# Patient Record
Sex: Female | Born: 1985
Health system: Southern US, Community
[De-identification: ages and names within clinical notes are randomized; demographics above are authoritative.]

## PROBLEM LIST (undated history)

## (undated) DIAGNOSIS — J45909 Unspecified asthma, uncomplicated: Secondary | ICD-10-CM

## (undated) DIAGNOSIS — F32A Depression, unspecified: Secondary | ICD-10-CM

## (undated) DIAGNOSIS — G43909 Migraine, unspecified, not intractable, without status migrainosus: Secondary | ICD-10-CM

## (undated) DIAGNOSIS — F419 Anxiety disorder, unspecified: Secondary | ICD-10-CM

## (undated) DIAGNOSIS — F4310 Post-traumatic stress disorder, unspecified: Secondary | ICD-10-CM

## (undated) DIAGNOSIS — J40 Bronchitis, not specified as acute or chronic: Secondary | ICD-10-CM

## (undated) HISTORY — DX: Depression, unspecified: F32.A

## (undated) HISTORY — PX: APPENDECTOMY: SHX54

## (undated) HISTORY — DX: Post-traumatic stress disorder, unspecified: F43.10

## (undated) HISTORY — PX: TONSILLECTOMY: SUR1361

## (undated) HISTORY — DX: Anxiety disorder, unspecified: F41.9

## (undated) HISTORY — DX: Migraine, unspecified, not intractable, without status migrainosus: G43.909

---

## 2017-06-14 ENCOUNTER — Emergency Department
Admission: EM | Admit: 2017-06-14 | Discharge: 2017-06-14 | Disposition: A | Discharge status: Home / Self Care | Payer: Self-pay | Attending: Emergency Medicine | Admitting: Emergency Medicine

## 2017-06-14 ENCOUNTER — Encounter: Payer: Self-pay | Admitting: Emergency Medicine

## 2017-06-14 ENCOUNTER — Other Ambulatory Visit: Payer: Self-pay

## 2017-06-14 DIAGNOSIS — M5441 Lumbago with sciatica, right side: Secondary | ICD-10-CM | POA: Insufficient documentation

## 2017-06-14 DIAGNOSIS — G8929 Other chronic pain: Secondary | ICD-10-CM

## 2017-06-14 MED ORDER — CYCLOBENZAPRINE HCL 10 MG PO TABS: 10.0000 mg | ORAL_TABLET | Freq: Three times a day (TID) | ORAL | 0 refills | Status: DC | PRN

## 2017-06-14 MED ORDER — NAPROXEN 500 MG PO TABS: 500.0000 mg | ORAL_TABLET | Freq: Two times a day (BID) | ORAL | 0 refills | Status: DC

## 2017-06-14 NOTE — ED Provider Notes (Signed)
[facility] Emergency Department Provider Note  ____________________________________________  Time seen: Approximately 3:55 PM  I have reviewed the triage vital signs and the nursing notes.   HISTORY  Chief Complaint Back Pain    HPI Wendy Proctor is a 32 y.o. female who presents the emergency department complaining of lower back pain.  Patient reports that she attempted to go to work when her lower back began to hurt.  Patient reports that she has had similar symptoms for "a long time."  No new trauma.  No dysuria or polyuria.  Patient denies any radicular symptoms.  Patient reports that "now that I have a job I cannot lay down like it normally do."  Patient is requesting 2 days off from work.  History reviewed. No pertinent past medical history.  There are no active problems to display for this patient.   History reviewed. No pertinent surgical history.  Prior to Admission medications   Medication Sig Start Date End Date Taking? Authorizing Provider  cyclobenzaprine (FLEXERIL) 10 MG tablet Take 1 tablet (10 mg total) by mouth 3 (three) times daily as needed for muscle spasms. 06/14/17   Cuthriell, Delorise RoyalsJonathan D, PA-C  naproxen (NAPROSYN) 500 MG tablet Take 1 tablet (500 mg total) by mouth 2 (two) times daily with a meal. 06/14/17   Cuthriell, Delorise RoyalsJonathan D, PA-C    Allergies Patient has no known allergies.  No family history on file.  Social History Social History   Tobacco Use  . Smoking status: Not on file  Substance Use Topics  . Alcohol use: Not on file  . Drug use: Not on file     Review of Systems  Constitutional: No fever/chills Eyes: No visual changes.  Cardiovascular: no chest pain. Respiratory: no cough. No SOB. Gastrointestinal: No abdominal pain.  No nausea, no vomiting.  No diarrhea.  No constipation. Genitourinary: Negative for dysuria. No hematuria Musculoskeletal: Positive for lower back pain. Skin: Negative for rash, abrasions,  lacerations, ecchymosis. Neurological: Negative for headaches, focal weakness or numbness. 10-point ROS otherwise negative.  ____________________________________________   PHYSICAL EXAM:  VITAL SIGNS: ED Triage Vitals  Enc Vitals Group     BP 06/14/17 1316 127/76     Pulse Rate 06/14/17 1316 70     Resp 06/14/17 1316 18     Temp 06/14/17 1316 98.8 F (37.1 C)     Temp Source 06/14/17 1316 Oral     SpO2 06/14/17 1316 98 %     Weight 06/14/17 1316 210 lb (95.3 kg)     Height 06/14/17 1316 5\' 3"  (1.6 m)     Head Circumference --      Peak Flow --      Pain Score 06/14/17 1326 8     Pain Loc --      Pain Edu? --      Excl. in GC? --      Constitutional: Alert and oriented. Well appearing and in no acute distress. Eyes: Conjunctivae are normal. PERRL. EOMI. Head: Atraumatic. ENT:      Ears:       Nose: No congestion/rhinnorhea.      Mouth/Throat: Mucous membranes are moist.  Neck: No stridor.  No cervical spine tenderness to palpation. Cardiovascular: Normal rate, regular rhythm. Normal S1 and S2.  Good peripheral circulation. Respiratory: Normal respiratory effort without tachypnea or retractions. Lungs CTAB. Good air entry to the bases with no decreased or absent breath sounds. Gastrointestinal: Bowel sounds 4 quadrants. Soft and nontender to palpation. No  guarding or rigidity. No palpable masses. No distention. No CVA tenderness. Musculoskeletal: Full range of motion to all extremities. No gross deformities appreciated. Neurologic:  Normal speech and language. No gross focal neurologic deficits are appreciated.  Skin:  Skin is warm, dry and intact. No rash noted. Psychiatric: Mood and affect are normal. Speech and behavior are normal. Patient exhibits appropriate insight and judgement.   ____________________________________________   LABS (all labs ordered are listed, but only abnormal results are displayed)  Labs Reviewed - No data to  display ____________________________________________  EKG   ____________________________________________  RADIOLOGY   No results found.  ____________________________________________    PROCEDURES  Procedure(s) performed:    Procedures    Medications - No data to display   ____________________________________________   INITIAL IMPRESSION / ASSESSMENT AND PLAN / ED COURSE  Pertinent labs & imaging results that were available during my care of the patient were reviewed by me and considered in my medical decision making (see chart for details).  Review of the [place] CSRS was performed in accordance of the [facility] prior to dispensing any controlled drugs.     Patient's diagnosis is consistent with lumbago.  Patient with mild flare of chronic back pain.  No concerning symptoms.  Exam is reassuring.  No indication for labs or imaging at this time.. Patient will be discharged home with prescriptions for Naprosyn and Flexeril. Patient is to follow up with primary care as needed or otherwise directed. Patient is given ED precautions to return to the ED for any worsening or new symptoms.     ____________________________________________  FINAL CLINICAL IMPRESSION(S) / ED DIAGNOSES  Final diagnoses:  Chronic midline low back pain with right-sided sciatica      NEW MEDICATIONS STARTED DURING THIS VISIT:  ED Discharge Orders        Ordered    naproxen (NAPROSYN) 500 MG tablet  2 times daily with meals     06/14/17 1606    cyclobenzaprine (FLEXERIL) 10 MG tablet  3 times daily PRN     06/14/17 1606          This chart was dictated using voice recognition software/Dragon. Despite best efforts to proofread, errors can occur which can change the meaning. Any change was purely unintentional.    Racheal Patches, PA-C 06/14/17 1608    Phineas Semen, MD 06/14/17 1630

## 2017-06-14 NOTE — ED Triage Notes (Signed)
Pt states she was unable to work today due to her back pain, reports on going pain at the bottom of her spine, states that working seems to aggravate it, pt states that she needs a note

## 2018-01-08 ENCOUNTER — Emergency Department
Admission: EM | Admit: 2018-01-08 | Discharge: 2018-01-08 | Disposition: A | Discharge status: Home / Self Care | Payer: Self-pay | Attending: Emergency Medicine | Admitting: Emergency Medicine

## 2018-01-08 ENCOUNTER — Encounter: Payer: Self-pay | Admitting: Emergency Medicine

## 2018-01-08 ENCOUNTER — Other Ambulatory Visit: Payer: Self-pay

## 2018-01-08 ENCOUNTER — Emergency Department: Payer: Self-pay

## 2018-01-08 DIAGNOSIS — J40 Bronchitis, not specified as acute or chronic: Secondary | ICD-10-CM | POA: Insufficient documentation

## 2018-01-08 DIAGNOSIS — F17210 Nicotine dependence, cigarettes, uncomplicated: Secondary | ICD-10-CM | POA: Insufficient documentation

## 2018-01-08 HISTORY — DX: Unspecified asthma, uncomplicated: J45.909

## 2018-01-08 HISTORY — DX: Bronchitis, not specified as acute or chronic: J40

## 2018-01-08 LAB — BASIC METABOLIC PANEL
ANION GAP: 7 (ref 5–15)
BUN: 9 mg/dL (ref 6–20)
CO2: 25 mmol/L (ref 22–32)
Calcium: 8.9 mg/dL (ref 8.9–10.3)
Chloride: 107 mmol/L (ref 98–111)
Creatinine, Ser: 0.6 mg/dL (ref 0.44–1.00)
GLUCOSE: 97 mg/dL (ref 70–99)
Potassium: 3.5 mmol/L (ref 3.5–5.1)
SODIUM: 139 mmol/L (ref 135–145)

## 2018-01-08 LAB — CBC
HCT: 44.9 % (ref 36.0–46.0)
Hemoglobin: 15.1 g/dL — ABNORMAL HIGH (ref 12.0–15.0)
MCH: 29.8 pg (ref 26.0–34.0)
MCHC: 33.6 g/dL (ref 30.0–36.0)
MCV: 88.7 fL (ref 80.0–100.0)
NRBC: 0 % (ref 0.0–0.2)
Platelets: 308 10*3/uL (ref 150–400)
RBC: 5.06 MIL/uL (ref 3.87–5.11)
RDW: 13.1 % (ref 11.5–15.5)
WBC: 10 10*3/uL (ref 4.0–10.5)

## 2018-01-08 LAB — TROPONIN I: Troponin I: <0.03 ng/mL (ref <0.03)

## 2018-01-08 MED ORDER — PREDNISONE 10 MG (21) PO TBPK: ORAL_TABLET | ORAL | 0 refills | Status: DC

## 2018-01-08 MED ORDER — ALBUTEROL SULFATE HFA 108 (90 BASE) MCG/ACT IN AERS: 2.0000 | INHALATION_SPRAY | Freq: Four times a day (QID) | RESPIRATORY_TRACT | 2 refills | Status: DC | PRN

## 2018-01-08 MED ORDER — AZITHROMYCIN 250 MG PO TABS: ORAL_TABLET | ORAL | 0 refills | Status: DC

## 2018-01-08 MED ORDER — PREDNISONE 20 MG PO TABS
60.0000 mg | ORAL_TABLET | Freq: Once | ORAL | Status: AC
  Administered 2018-01-08: 60 mg via ORAL
  Filled 2018-01-08: qty 3

## 2018-01-08 MED ORDER — IPRATROPIUM-ALBUTEROL 0.5-2.5 (3) MG/3ML IN SOLN
3.0000 mL | Freq: Once | RESPIRATORY_TRACT | Status: AC
  Administered 2018-01-08: 3 mL via RESPIRATORY_TRACT
  Filled 2018-01-08: qty 3

## 2018-01-08 NOTE — ED Notes (Signed)
FIRST NURSE NOTE:  Pt c/o cough and congestion for the past 3 weeks, no respiratory distress noted on arrival.

## 2018-01-08 NOTE — ED Triage Notes (Signed)
Pt to ED from home c/o SOB for a couple weeks progressively getting worse.  Pt states hx of asthma and bronchitis.  Lives in homeless shelter and has no nebulizers.  States productive green/white cough.  Pt speaking in complete and coherent sentences, skin warm and dry, chest rise even and unlabored in triage at this time.

## 2018-01-08 NOTE — ED Notes (Signed)
Pt transported to XR.  

## 2018-01-08 NOTE — ED Provider Notes (Signed)
[facility] Emergency Department Provider Note       Time seen: ----------------------------------------- 6:21 PM on 01/08/2018 -----------------------------------------   I have reviewed the triage vital signs and the nursing notes.  HISTORY   Chief Complaint Shortness of Breath    HPI Wendy Proctor is a 32 y.o. female with a history of asthma and bronchitis who presents to the ED for shortness of breath for the past several weeks that is progressively gotten worse.  Patient reports a history of asthma bronchitis, lives at home a shelter and has no nebulizer.  She states she has a productive green and white sputum.  Past Medical History:  Diagnosis Date  . Asthma   . Bronchitis     There are no active problems to display for this patient.   Past Surgical History:  Procedure Laterality Date  . APPENDECTOMY    . TONSILLECTOMY      Allergies Patient has no known allergies.  Social History Social History   Tobacco Use  . Smoking status: Current Every Day Smoker    Packs/day: 0.50    Types: Cigarettes  . Smokeless tobacco: Never Used  Substance Use Topics  . Alcohol use: Never    Frequency: Never  . Drug use: Yes    Types: Marijuana   Review of Systems Constitutional: Negative for fever. Cardiovascular: Negative for chest pain. Respiratory: Positive shortness of breath, cough with sputum production Gastrointestinal: Negative for abdominal pain, vomiting and diarrhea. Musculoskeletal: Negative for back pain. Skin: Negative for rash. Neurological: Negative for headaches, focal weakness or numbness.  All systems negative/normal/unremarkable except as stated in the HPI  ____________________________________________   PHYSICAL EXAM:  VITAL SIGNS: ED Triage Vitals  Enc Vitals Group     BP 01/08/18 1817 122/75     Pulse Rate 01/08/18 1817 89     Resp 01/08/18 1817 18     Temp 01/08/18 1817 98.7 F (37.1 C)     Temp Source  01/08/18 1817 Oral     SpO2 01/08/18 1817 98 %     Weight 01/08/18 1810 210 lb (95.3 kg)     Height 01/08/18 1810 5\' 3"  (1.6 m)     Head Circumference --      Peak Flow --      Pain Score 01/08/18 1809 5     Pain Loc --      Pain Edu? --      Excl. in GC? --    Constitutional: Alert and oriented. Well appearing and in no distress. Eyes: Conjunctivae are normal. Normal extraocular movements. Cardiovascular: Normal rate, regular rhythm. No murmurs, rubs, or gallops. Respiratory: Normal respiratory effort without tachypnea nor retractions. Breath sounds are clear and equal bilaterally. No wheezes/rales/rhonchi. Gastrointestinal: Soft and nontender. Normal bowel sounds Musculoskeletal: Nontender with normal range of motion in extremities. No lower extremity tenderness nor edema. Neurologic:  Normal speech and language. No gross focal neurologic deficits are appreciated.  Skin:  Skin is warm, dry and intact. No rash noted. Psychiatric: Mood and affect are normal. Speech and behavior are normal.  ____________________________________________  ED COURSE:  As part of my medical decision making, I reviewed the following data within the electronic MEDICAL RECORD NUMBER History obtained from family if available, nursing notes, old chart and ekg, as well as notes from prior ED visits. Patient presented for shortness of breath and cough with sputum production, we will assess with labs and imaging as indicated at this time.   Procedures ____________________________________________  LABS (pertinent positives/negatives)  Labs Reviewed  CBC - Abnormal; Notable for the following components:      Result Value   Hemoglobin 15.1 (*)    All other components within normal limits  BASIC METABOLIC PANEL  TROPONIN I  POC URINE PREG, ED    RADIOLOGY Images were viewed by me  Chest x-ray IMPRESSION: No acute pulmonary process identified. ____________________________________________  DIFFERENTIAL  DIAGNOSIS   Bronchitis, asthma, pneumonia, URI, influenza  FINAL ASSESSMENT AND PLAN  Bronchitis   Plan: The patient had presented for persistent cough. Patient's labs are unremarkable. Patient's imaging are also unremarkable.  She will be discharged with steroids and antibiotics and is cleared for outpatient follow-up.   Ulice Dash, MD   Note: This note was generated in part or whole with voice recognition software. Voice recognition is usually quite accurate but there are transcription errors that can and very often do occur. I apologize for any typographical errors that were not detected and corrected.     Emily Filbert, MD 01/08/18 [phone]

## 2018-01-13 MED FILL — AZITHROMYCIN 250 MG TABLET: 250 | 5 days supply | Qty: 6 | Fill #0

## 2018-01-13 MED FILL — predniSONE 10 MG TABS: 10 | 6 days supply | Qty: 21 | Fill #0

## 2018-08-31 ENCOUNTER — Encounter: Payer: Self-pay | Admitting: Emergency Medicine

## 2018-08-31 ENCOUNTER — Emergency Department
Admission: EM | Admit: 2018-08-31 | Discharge: 2018-08-31 | Discharge status: Against Medical Advice | Payer: Self-pay | Attending: Emergency Medicine | Admitting: Emergency Medicine

## 2018-08-31 ENCOUNTER — Other Ambulatory Visit: Payer: Self-pay

## 2018-08-31 DIAGNOSIS — Z5321 Procedure and treatment not carried out due to patient leaving prior to being seen by health care provider: Secondary | ICD-10-CM | POA: Insufficient documentation

## 2018-08-31 DIAGNOSIS — N939 Abnormal uterine and vaginal bleeding, unspecified: Secondary | ICD-10-CM | POA: Insufficient documentation

## 2018-08-31 DIAGNOSIS — R197 Diarrhea, unspecified: Secondary | ICD-10-CM | POA: Insufficient documentation

## 2018-08-31 LAB — URINALYSIS, COMPLETE (UACMP) WITH MICROSCOPIC
Bacteria, UA: NONE SEEN
Bilirubin Urine: NEGATIVE
Glucose, UA: NEGATIVE mg/dL
Ketones, ur: 5 mg/dL — AB
Nitrite: NEGATIVE
Protein, ur: 30 mg/dL — AB
RBC / HPF: >50 RBC/hpf — ABNORMAL HIGH (ref 0–5)
Specific Gravity, Urine: 1.033 — ABNORMAL HIGH (ref 1.005–1.030)
pH: 5 (ref 5.0–8.0)

## 2018-08-31 LAB — COMPREHENSIVE METABOLIC PANEL
ALT: 45 U/L — ABNORMAL HIGH (ref 0–44)
AST: 39 U/L (ref 15–41)
Albumin: 3.7 g/dL (ref 3.5–5.0)
Alkaline Phosphatase: 68 U/L (ref 38–126)
Anion gap: 8 (ref 5–15)
BUN: 8 mg/dL (ref 6–20)
CO2: 20 mmol/L — ABNORMAL LOW (ref 22–32)
Calcium: 8.6 mg/dL — ABNORMAL LOW (ref 8.9–10.3)
Chloride: 112 mmol/L — ABNORMAL HIGH (ref 98–111)
Creatinine, Ser: 0.58 mg/dL (ref 0.44–1.00)
GFR calc Af Amer: >60 mL/min (ref >60)
GFR calc non Af Amer: >60 mL/min (ref >60)
Glucose, Bld: 151 mg/dL — ABNORMAL HIGH (ref 70–99)
Potassium: 3.3 mmol/L — ABNORMAL LOW (ref 3.5–5.1)
Sodium: 140 mmol/L (ref 135–145)
Total Bilirubin: 0.6 mg/dL (ref 0.3–1.2)
Total Protein: 7.3 g/dL (ref 6.5–8.1)

## 2018-08-31 LAB — CBC
HCT: 39.5 % (ref 36.0–46.0)
Hemoglobin: 13.6 g/dL (ref 12.0–15.0)
MCH: 30.8 pg (ref 26.0–34.0)
MCHC: 34.4 g/dL (ref 30.0–36.0)
MCV: 89.6 fL (ref 80.0–100.0)
Platelets: 267 10*3/uL (ref 150–400)
RBC: 4.41 MIL/uL (ref 3.87–5.11)
RDW: 12.8 % (ref 11.5–15.5)
WBC: 9 10*3/uL (ref 4.0–10.5)
nRBC: 0 % (ref 0.0–0.2)

## 2018-08-31 LAB — LIPASE, BLOOD: Lipase: 73 U/L — ABNORMAL HIGH (ref 11–51)

## 2018-08-31 LAB — POCT PREGNANCY, URINE: Preg Test, Ur: NEGATIVE

## 2018-08-31 NOTE — ED Triage Notes (Signed)
Pt arrives with complaints of lower abdominal pain that started yesterday. Pt reports she started her menstrual cycle yesterday. PT concerned it was "more than just my period because I am passing big clots." Pt also reports diarrhea that started yesterday. Pt denies nausea/vomitting.

## 2018-08-31 NOTE — ED Notes (Signed)
Pt to STAT desk. States she is wanting to leave. Educated about risks. Verbalized understanding.

## 2018-09-03 ENCOUNTER — Telehealth: Payer: Self-pay | Admitting: Emergency Medicine

## 2018-09-03 NOTE — Telephone Encounter (Signed)
Called patient due to lwot to inquire about condition and follow up plans.  No answer and no voicemail  

## 2019-03-02 ENCOUNTER — Emergency Department
Admission: EM | Admit: 2019-03-02 | Discharge: 2019-03-02 | Disposition: A | Discharge status: Home / Self Care | Payer: Self-pay | Attending: Emergency Medicine | Admitting: Emergency Medicine

## 2019-03-02 ENCOUNTER — Emergency Department: Payer: Self-pay

## 2019-03-02 ENCOUNTER — Other Ambulatory Visit: Payer: Self-pay

## 2019-03-02 DIAGNOSIS — J45909 Unspecified asthma, uncomplicated: Secondary | ICD-10-CM | POA: Insufficient documentation

## 2019-03-02 DIAGNOSIS — N7011 Chronic salpingitis: Secondary | ICD-10-CM | POA: Insufficient documentation

## 2019-03-02 DIAGNOSIS — F17210 Nicotine dependence, cigarettes, uncomplicated: Secondary | ICD-10-CM | POA: Insufficient documentation

## 2019-03-02 DIAGNOSIS — R102 Pelvic and perineal pain: Secondary | ICD-10-CM

## 2019-03-02 DIAGNOSIS — Z79899 Other long term (current) drug therapy: Secondary | ICD-10-CM | POA: Insufficient documentation

## 2019-03-02 LAB — CBC
HCT: 41.2 % (ref 36.0–46.0)
Hemoglobin: 14.2 g/dL (ref 12.0–15.0)
MCH: 31.2 pg (ref 26.0–34.0)
MCHC: 34.5 g/dL (ref 30.0–36.0)
MCV: 90.5 fL (ref 80.0–100.0)
Platelets: 251 10*3/uL (ref 150–400)
RBC: 4.55 MIL/uL (ref 3.87–5.11)
RDW: 12.3 % (ref 11.5–15.5)
WBC: 9.2 10*3/uL (ref 4.0–10.5)
nRBC: 0 % (ref 0.0–0.2)

## 2019-03-02 LAB — URINALYSIS, COMPLETE (UACMP) WITH MICROSCOPIC
Bacteria, UA: NONE SEEN
Bilirubin Urine: NEGATIVE
Glucose, UA: NEGATIVE mg/dL
Hgb urine dipstick: NEGATIVE
Ketones, ur: NEGATIVE mg/dL
Leukocytes,Ua: NEGATIVE
Nitrite: NEGATIVE
Protein, ur: NEGATIVE mg/dL
Specific Gravity, Urine: 1.029 (ref 1.005–1.030)
pH: 5 (ref 5.0–8.0)

## 2019-03-02 LAB — BASIC METABOLIC PANEL
Anion gap: 8 (ref 5–15)
BUN: 11 mg/dL (ref 6–20)
CO2: 24 mmol/L (ref 22–32)
Calcium: 8.9 mg/dL (ref 8.9–10.3)
Chloride: 107 mmol/L (ref 98–111)
Creatinine, Ser: 0.69 mg/dL (ref 0.44–1.00)
GFR calc Af Amer: >60 mL/min (ref >60)
GFR calc non Af Amer: >60 mL/min (ref >60)
Glucose, Bld: 101 mg/dL — ABNORMAL HIGH (ref 70–99)
Potassium: 4 mmol/L (ref 3.5–5.1)
Sodium: 139 mmol/L (ref 135–145)

## 2019-03-02 LAB — POCT PREGNANCY, URINE: Preg Test, Ur: NEGATIVE

## 2019-03-02 MED ORDER — ALBUTEROL SULFATE (2.5 MG/3ML) 0.083% IN NEBU
2.5000 mg | INHALATION_SOLUTION | Freq: Once | RESPIRATORY_TRACT | Status: AC
  Administered 2019-03-02: 16:00:00 2.5 mg via RESPIRATORY_TRACT
  Filled 2019-03-02: qty 3

## 2019-03-02 MED ORDER — ALBUTEROL SULFATE HFA 108 (90 BASE) MCG/ACT IN AERS: 2.0000 | INHALATION_SPRAY | Freq: Four times a day (QID) | RESPIRATORY_TRACT | 1 refills | Status: DC | PRN

## 2019-03-02 MED ORDER — PREDNISONE 10 MG (21) PO TBPK: ORAL_TABLET | ORAL | 0 refills | Status: DC

## 2019-03-02 NOTE — ED Provider Notes (Signed)
[facility] Emergency Department Provider Note ____________________________________________   None    (approximate)  I have reviewed the triage vital signs and the nursing notes.   HISTORY  Chief Complaint URI and Vaginal Bleeding  HPI Wendy Proctor is a 34 y.o. female presents to the emergency department for treatment and evaluation of pelvic cramping that started a few days ago. She had a normal menstrual cycle a couple of weeks ago and has had some spotting for the past 2-3 days. She denies dysuria or urinary frequency.  She is also requesting a refill of albuterol due to chronic bronchitis. She denies new concerns related to bronchitis but is out of her albuterol.    Past Medical History:  Diagnosis Date  . Asthma   . Bronchitis     There are no problems to display for this patient.   Past Surgical History:  Procedure Laterality Date  . APPENDECTOMY    . TONSILLECTOMY      Prior to Admission medications   Medication Sig Start Date End Date Taking? Authorizing Provider  albuterol (VENTOLIN HFA) 108 (90 Base) MCG/ACT inhaler Inhale 2 puffs into the lungs every 6 (six) hours as needed for wheezing or shortness of breath. 03/02/19   Boone Gear, Rulon Eisenmenger B, FNP  azithromycin (ZITHROMAX Z-PAK) 250 MG tablet Take 2 tablets (500 mg) on  Day 1,  followed by 1 tablet (250 mg) once daily on Days 2 through 5. 01/08/18   Emily Filbert, MD  cyclobenzaprine (FLEXERIL) 10 MG tablet Take 1 tablet (10 mg total) by mouth 3 (three) times daily as needed for muscle spasms. 06/14/17   Cuthriell, Delorise Royals, PA-C  naproxen (NAPROSYN) 500 MG tablet Take 1 tablet (500 mg total) by mouth 2 (two) times daily with a meal. 06/14/17   Cuthriell, Delorise Royals, PA-C  predniSONE (STERAPRED UNI-PAK 21 TAB) 10 MG (21) TBPK tablet Take 6 tablets on the first day and decrease by 1 tablet each day until finished. 03/02/19   Chinita Pester, FNP    Allergies Patient has no known  allergies.  No family history on file.  Social History Social History   Tobacco Use  . Smoking status: Current Every Day Smoker    Packs/day: 0.50    Types: Cigarettes  . Smokeless tobacco: Never Used  Substance Use Topics  . Alcohol use: Never  . Drug use: Yes    Types: Marijuana    Review of Systems  Constitutional: No fever/chills Eyes: No visual changes. ENT: No sore throat. Cardiovascular: Denies chest pain. Respiratory: Intermittent shortness of breath. Chronic cough. Gastrointestinal: Positive for pelvic pain. No nausea, no vomiting.  No diarrhea.  No constipation. Genitourinary: Negative for dysuria. Musculoskeletal: Negative for back pain. Skin: Negative for rash. Neurological: Negative for headaches, focal weakness or numbness. ___________________________________________   PHYSICAL EXAM:  VITAL SIGNS: ED Triage Vitals [03/02/19 1349]  Enc Vitals Group     BP (!) 141/79     Pulse Rate 70     Resp 16     Temp 97.9 F (36.6 C)     Temp Source Oral     SpO2 99 %     Weight 215 lb (97.5 kg)     Height 5\' 5"  (1.651 m)     Head Circumference      Peak Flow      Pain Score 7     Pain Loc      Pain Edu?      Excl. in  GC?     Constitutional: Alert and oriented. Well appearing and in no acute distress. Eyes: Conjunctivae are normal. PERRL. EOMI. Head: Atraumatic. Nose: No congestion/rhinnorhea. Mouth/Throat: Mucous membranes are moist.  Oropharynx non-erythematous. Neck: No stridor.   Hematological/Lymphatic/Immunilogical: No cervical lymphadenopathy. Cardiovascular: Normal rate, regular rhythm. Grossly normal heart sounds.  Good peripheral circulation. Respiratory: Normal respiratory effort.  No retractions. Lungs CTAB. Gastrointestinal: Soft and nontender. No distention. No abdominal bruits. No CVA tenderness. Genitourinary:  Musculoskeletal: No lower extremity tenderness nor edema.  No joint effusions. Neurologic:  Normal speech and language. No  gross focal neurologic deficits are appreciated. No gait instability. Skin:  Skin is warm, dry and intact. No rash noted. Psychiatric: Mood and affect are normal. Speech and behavior are normal. ____________________________________________   LABS (all labs ordered are listed, but only abnormal results are displayed)  Labs Reviewed  BASIC METABOLIC PANEL - Abnormal; Notable for the following components:      Result Value   Glucose, Bld 101 (*)    All other components within normal limits  URINALYSIS, COMPLETE (UACMP) WITH MICROSCOPIC - Abnormal; Notable for the following components:   Color, Urine AMBER (*)    APPearance TURBID (*)    All other components within normal limits  CBC  POC URINE PREG, ED  POCT PREGNANCY, URINE   ____________________________________________  EKG  Not indicated. ____________________________________________  RADIOLOGY  ED MD interpretation:    US shows: Bilateral expanded tubular fluid collections suggest bilateral  hydrosalpinx.      Official radiology report(s): US PELVIC COMPLETE WITH TRANSVAGINAL  Result Date: 03/02/2019 CLINICAL DATA:  Spotting, pelvic pain. Severe cramping. Negative urine pregnancy test. EXAM: TRANSABDOMINAL AND TRANSVAGINAL ULTRASOUND OF PELVIS TECHNIQUE: Both transabdominal and transvaginal ultrasound examinations of the pelvis were performed. Transabdominal technique was performed for global imaging of the pelvis including uterus, ovaries, adnexal regions, and pelvic cul-de-sac. It was necessary to proceed with endovaginal exam following the transabdominal exam to visualize the endometrium and ovaries. COMPARISON:  None FINDINGS: Uterus Measurements: 6.6 x 3.4 x 5.0 cm = volume: 59 mL. No fibroids or other mass visualized. Endometrium Thickness: 7 mm. Normal thickness for premenopausal female. No focal abnormality visualized. Right ovary Measurements: 4.0 x 2.9 x 2.8 cm = volume: 17.2 mL. Dominant follicle in the RIGHT ovary.  Adjacent to the RIGHT ovary is a anechoic tubular structure suggesting fluid in a dilated fallopian tube. Structure measures 3.7 x 2.8 x 2.4 cm. Left ovary Measurements: 5.1 x 3.2 x 2.6 cm = volume: 22 mL. Serpiginous low-attenuation structures suggesting dilated fallopian tube. Tubular structure measures 3.8 x 0.9 x 2.2 cm. Other findings No abnormal free fluid. IMPRESSION: 1. Normal uterus and ovaries. 2. 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 Recommend nonemergent gyn consultation and potential pelvic MRI evaluation. Electronically Signed   By: Suzy Bouchard M.D.   On: 03/02/2019 18:05    ____________________________________________   PROCEDURES  Procedure(s) performed (including Critical Care):  Procedures  ____________________________________________   INITIAL IMPRESSION / ASSESSMENT AND PLAN     34 year old female presenting to the emergency department for treatment and evaluation of pelvic pain.  See HPI for further details.  Patient also states that she is out of her albuterol inhaler and is having some shortness of breath and cough in the mornings.  No known exposure to COVID-19.  No fever or other symptoms of concern.  DIFFERENTIAL DIAGNOSIS  Chronic bronchitis, COVID-19, influenza Ovarian cyst, pelvic pain, endometriosis,  ED COURSE  Ultrasound shows bilateral hydrosalpinx.  Patient again questioned about any vaginal discharge or dyspareunia to which she denies. No evidence of acute PID. Labs including urinalysis are reassuring.Pregnancy test is negative.  She was advised that she will need close follow-up with gynecology.  She does not currently have a gynecologist and she states that her insurance has not yet started.  Advised her to call Dr. Johnathan Hausen office and request an appointment.  If she is unable to get an appointment there, she is to schedule an appointment with the health department or [facility].  Refills for  her albuterol were submitted as well as prednisone.   If she develops any worsening symptoms and is unable to schedule appointment, she is to return to the emergency department. ____________________________________________   FINAL CLINICAL IMPRESSION(S) / ED DIAGNOSES  Final diagnoses:  Pelvic pain  Hydrosalpinx     ED Discharge Orders         Ordered    predniSONE (STERAPRED UNI-PAK 21 TAB) 10 MG (21) TBPK tablet     03/02/19 1824    albuterol (VENTOLIN HFA) 108 (90 Base) MCG/ACT inhaler  Every 6 hours PRN     03/02/19 1824           Note:  This document was prepared using Dragon voice recognition software and may include unintentional dictation errors.   Chinita Pester, FNP 03/02/19 1953    Concha Se, MD 03/02/19 2202

## 2019-03-02 NOTE — ED Triage Notes (Signed)
Pt c/o cough with congestion, states she keeps bronchitis with asthma and states she needs an inhaler, states also she had her normal period a couple weeks ago and is spotting again with severe cramping.

## 2019-03-02 NOTE — Discharge Instructions (Signed)
Call Dr. Johnathan Hausen office and request an appointment. If they are unable to see you, please call the health department and request an appointment.

## 2019-03-02 NOTE — ED Notes (Signed)
Patient ambulatory to lobby with steady gait and NAD noted. Verbalized understanding of discharge instructions and follow-up care.  

## 2019-03-14 ENCOUNTER — Telehealth (HOSPITAL_COMMUNITY): Payer: Self-pay | Admitting: *Deleted

## 2019-03-14 NOTE — Telephone Encounter (Signed)
Patient called in regards to needing to follow-up with an OBGYN. Explained to patient that we can only schedule her an appointment for a Pap smear and that a GYN consult is recommended. Patient stated she does not have insurance. Gave her the phone number to the [facility] for [facility] and that she will need to complete the [facility] Assistance Application. Explained to patient that she can get the application online at the [facility] web site or from the Center for [employer]. Patient stated she will call and schedule appointment and verbalized understanding.

## 2019-03-16 ENCOUNTER — Encounter: Payer: Self-pay | Admitting: Obstetrics & Gynecology

## 2020-05-01 ENCOUNTER — Emergency Department: Payer: Self-pay

## 2020-05-01 ENCOUNTER — Emergency Department
Admission: EM | Admit: 2020-05-01 | Discharge: 2020-05-01 | Disposition: A | Discharge status: Home / Self Care | Payer: Self-pay | Attending: Emergency Medicine | Admitting: Emergency Medicine

## 2020-05-01 ENCOUNTER — Other Ambulatory Visit: Payer: Self-pay

## 2020-05-01 DIAGNOSIS — R102 Pelvic and perineal pain: Secondary | ICD-10-CM

## 2020-05-01 DIAGNOSIS — Z8742 Personal history of other diseases of the female genital tract: Secondary | ICD-10-CM

## 2020-05-01 DIAGNOSIS — Z79899 Other long term (current) drug therapy: Secondary | ICD-10-CM | POA: Insufficient documentation

## 2020-05-01 DIAGNOSIS — F17210 Nicotine dependence, cigarettes, uncomplicated: Secondary | ICD-10-CM | POA: Insufficient documentation

## 2020-05-01 DIAGNOSIS — N939 Abnormal uterine and vaginal bleeding, unspecified: Secondary | ICD-10-CM

## 2020-05-01 DIAGNOSIS — N7011 Chronic salpingitis: Secondary | ICD-10-CM | POA: Insufficient documentation

## 2020-05-01 DIAGNOSIS — J45909 Unspecified asthma, uncomplicated: Secondary | ICD-10-CM | POA: Insufficient documentation

## 2020-05-01 LAB — COMPREHENSIVE METABOLIC PANEL
ALT: 51 U/L — ABNORMAL HIGH (ref 0–44)
AST: 35 U/L (ref 15–41)
Albumin: 3.4 g/dL — ABNORMAL LOW (ref 3.5–5.0)
Alkaline Phosphatase: 86 U/L (ref 38–126)
Anion gap: 7 (ref 5–15)
BUN: 9 mg/dL (ref 6–20)
CO2: 21 mmol/L — ABNORMAL LOW (ref 22–32)
Calcium: 8.7 mg/dL — ABNORMAL LOW (ref 8.9–10.3)
Chloride: 108 mmol/L (ref 98–111)
Creatinine, Ser: 0.6 mg/dL (ref 0.44–1.00)
GFR, Estimated: >60 mL/min (ref >60)
Glucose, Bld: 97 mg/dL (ref 70–99)
Potassium: 3.6 mmol/L (ref 3.5–5.1)
Sodium: 136 mmol/L (ref 135–145)
Total Bilirubin: 0.6 mg/dL (ref 0.3–1.2)
Total Protein: 7.4 g/dL (ref 6.5–8.1)

## 2020-05-01 LAB — CBC
HCT: 41.5 % (ref 36.0–46.0)
Hemoglobin: 14.3 g/dL (ref 12.0–15.0)
MCH: 31.4 pg (ref 26.0–34.0)
MCHC: 34.5 g/dL (ref 30.0–36.0)
MCV: 91 fL (ref 80.0–100.0)
Platelets: 241 10*3/uL (ref 150–400)
RBC: 4.56 MIL/uL (ref 3.87–5.11)
RDW: 12.8 % (ref 11.5–15.5)
WBC: 9.6 10*3/uL (ref 4.0–10.5)
nRBC: 0 % (ref 0.0–0.2)

## 2020-05-01 LAB — URINALYSIS, COMPLETE (UACMP) WITH MICROSCOPIC
Bacteria, UA: NONE SEEN
Bilirubin Urine: NEGATIVE
Glucose, UA: NEGATIVE mg/dL
Ketones, ur: NEGATIVE mg/dL
Nitrite: NEGATIVE
Protein, ur: NEGATIVE mg/dL
Specific Gravity, Urine: 1.021 (ref 1.005–1.030)
pH: 8 (ref 5.0–8.0)

## 2020-05-01 LAB — LIPASE, BLOOD: Lipase: 33 U/L (ref 11–51)

## 2020-05-01 LAB — POC URINE PREG, ED: Preg Test, Ur: NEGATIVE

## 2020-05-01 LAB — SAMPLE TO BLOOD BANK

## 2020-05-01 MED ORDER — KETOROLAC TROMETHAMINE 30 MG/ML IJ SOLN
15.0000 mg | Freq: Once | INTRAMUSCULAR | Status: AC
  Administered 2020-05-01: 15 mg via INTRAVENOUS
  Filled 2020-05-01: qty 1

## 2020-05-01 MED ORDER — MELOXICAM 7.5 MG PO TABS: 7.5000 mg | ORAL_TABLET | Freq: Every day | ORAL | 0 refills | Status: AC | PRN

## 2020-05-01 MED ORDER — KETOROLAC TROMETHAMINE 30 MG/ML IJ SOLN: 15.0000 mg | Freq: Once | INTRAMUSCULAR | Status: DC

## 2020-05-01 MED ORDER — LACTATED RINGERS IV BOLUS
1000.0000 mL | Freq: Once | INTRAVENOUS | Status: AC
  Administered 2020-05-01: 1000 mL via INTRAVENOUS

## 2020-05-01 MED ORDER — ONDANSETRON 4 MG PO TBDP: 4.0000 mg | ORAL_TABLET | Freq: Three times a day (TID) | ORAL | 0 refills | Status: DC | PRN

## 2020-05-01 MED ORDER — KETOROLAC TROMETHAMINE 60 MG/2ML IM SOLN: 15.0000 mg | Freq: Once | INTRAMUSCULAR | Status: DC

## 2020-05-01 NOTE — Discharge Instructions (Signed)
You are being discharged with 2 prescriptions: Mobic/meloxicam anti-inflammatory to take 1 time daily for pain. Zofran nausea medicine to take up to 3-4 times daily as needed for nausea.  Use Tylenol for pain and fevers.  Up to 1000 mg per dose, up to 4 times per day.  Do not take more than 4000 mg of Tylenol/acetaminophen within 24 hours..  Please follow-up with OB/GYN doctor in the clinic.  I have attached the phone number for them.  If you develop any worsening symptoms despite these medications, fevers with your symptoms, please return to the ED.

## 2020-05-01 NOTE — ED Provider Notes (Signed)
[facility] Emergency Department Provider Note ____________________________________________   Event Date/Time   First MD Initiated Contact with Patient 05/01/20 1156     (approximate)  I have reviewed the triage vital signs and the nursing notes.  HISTORY  Chief Complaint Abdominal Pain and Vaginal Bleeding   HPI Zanobia Ojo is a 35 y.o. femalewho presents to the ED for evaluation of vaginal bleeding.   Chart review indicates G0, P0 evaluated 6 months ago by [facility] for chronic pelvic pain.  History of bilateral hydrosalpinx.  Endometriosis.  Patient presents to the ED for evaluation of bilateral lower abdominal pain with vaginal bleeding for the past 10 days.  She reports a typical and brief menstrual period around Valentine's Day, 3 weeks ago, stopped bleeding for a few days, and it has been bleeding consistently for the past 10 days.  She reports occasional passage of clots and associated cramping discomfort to her lower abdomen with her bleeding.  She denies additional discharge, itching or burning sensation, dysuria, hematuria, stool changes or diarrhea.  Denies postprandial nausea or pain, denies emesis.  Denies fever.  Past Medical History:  Diagnosis Date  . Asthma   . Bronchitis     There are no problems to display for this patient.   Past Surgical History:  Procedure Laterality Date  . APPENDECTOMY    . TONSILLECTOMY      Prior to Admission medications   Medication Sig Start Date End Date Taking? Authorizing Provider  meloxicam (MOBIC) 7.5 MG tablet Take 1 tablet (7.5 mg total) by mouth daily as needed for pain. 05/01/20 05/31/20 Yes Delton Prairie, MD  ondansetron (ZOFRAN ODT) 4 MG disintegrating tablet Take 1 tablet (4 mg total) by mouth every 8 (eight) hours as needed for nausea or vomiting. 05/01/20  Yes Delton Prairie, MD  albuterol (VENTOLIN HFA) 108 (90 Base) MCG/ACT inhaler Inhale 2 puffs into the lungs every 6 (six) hours as needed for  wheezing or shortness of breath. 03/02/19   Triplett, Rulon Eisenmenger B, FNP  azithromycin (ZITHROMAX Z-PAK) 250 MG tablet Take 2 tablets (500 mg) on  Day 1,  followed by 1 tablet (250 mg) once daily on Days 2 through 5. 01/08/18   Emily Filbert, MD  cyclobenzaprine (FLEXERIL) 10 MG tablet Take 1 tablet (10 mg total) by mouth 3 (three) times daily as needed for muscle spasms. 06/14/17   Cuthriell, Delorise Royals, PA-C  naproxen (NAPROSYN) 500 MG tablet Take 1 tablet (500 mg total) by mouth 2 (two) times daily with a meal. 06/14/17   Cuthriell, Delorise Royals, PA-C  predniSONE (STERAPRED UNI-PAK 21 TAB) 10 MG (21) TBPK tablet Take 6 tablets on the first day and decrease by 1 tablet each day until finished. 03/02/19   Chinita Pester, FNP    Allergies Patient has no known allergies.  No family history on file.  Social History Social History   Tobacco Use  . Smoking status: Current Every Day Smoker    Packs/day: 0.50    Types: Cigarettes  . Smokeless tobacco: Never Used  Substance Use Topics  . Alcohol use: Never  . Drug use: Yes    Types: Marijuana    Review of Systems  Constitutional: No fever/chills Eyes: No visual changes. ENT: No sore throat. Cardiovascular: Denies chest pain. Respiratory: Denies shortness of breath. Gastrointestinal:  No nausea, no vomiting.  No diarrhea.  No constipation. Positive for lower abdominal cramping discomfort bilaterally. Genitourinary: Negative for dysuria.  Positive for vaginal bleeding. Musculoskeletal:  Negative for back pain. Skin: Negative for rash. Neurological: Negative for headaches, focal weakness or numbness.  ____________________________________________   PHYSICAL EXAM:  VITAL SIGNS: Vitals:   05/01/20 1120 05/01/20 1427  BP: 122/87 127/77  Pulse: 77 (!) 56  Resp: 20 17  Temp: 97.8 F (36.6 C)   SpO2: 97% 96%     Constitutional: Alert and oriented. Well appearing and in no acute distress. Eyes: Conjunctivae are normal. PERRL.  EOMI. Head: Atraumatic. Nose: No congestion/rhinnorhea. Mouth/Throat: Mucous membranes are moist.  Oropharynx non-erythematous. Neck: No stridor. No cervical spine tenderness to palpation. Cardiovascular: Normal rate, regular rhythm. Grossly normal heart sounds.  Good peripheral circulation. Respiratory: Normal respiratory effort.  No retractions. Lungs CTAB. Gastrointestinal: Soft , nondistended. No CVA tenderness. Mild and poorly localizing suprapubic and medial RLQ and medial LLQ tenderness to palpation.  Upper abdomen is benign.  No CVA tenderness bilaterally.  No inguinal masses, tenderness .  GU examination deferred Musculoskeletal: No lower extremity tenderness nor edema.  No joint effusions. No signs of acute trauma. Neurologic:  Normal speech and language. No gross focal neurologic deficits are appreciated. No gait instability noted. Skin:  Skin is warm, dry and intact. No rash noted. Psychiatric: Mood and affect are normal. Speech and behavior are normal.  ____________________________________________   LABS (all labs ordered are listed, but only abnormal results are displayed)  Labs Reviewed  COMPREHENSIVE METABOLIC PANEL - Abnormal; Notable for the following components:      Result Value   CO2 21 (*)    Calcium 8.7 (*)    Albumin 3.4 (*)    ALT 51 (*)    All other components within normal limits  URINALYSIS, COMPLETE (UACMP) WITH MICROSCOPIC - Abnormal; Notable for the following components:   Color, Urine YELLOW (*)    APPearance CLOUDY (*)    Hgb urine dipstick MODERATE (*)    Leukocytes,Ua MODERATE (*)    All other components within normal limits  URINE CULTURE  LIPASE, BLOOD  CBC  POC URINE PREG, ED  SAMPLE TO BLOOD BANK   ____________________________________________  RADIOLOGY  ED MD interpretation: CT renal study reviewed by me without evidence of obstructing ureterolithiasis.  Official radiology report(s): CT Renal Stone Study  Result Date:  05/01/2020 CLINICAL DATA:  Right-sided flank pain.  Question kidney stone EXAM: CT ABDOMEN AND PELVIS WITHOUT CONTRAST TECHNIQUE: Multidetector CT imaging of the abdomen and pelvis was performed following the standard protocol without IV contrast. COMPARISON:  Pelvic ultrasound 03/02/2019. FINDINGS: Lower chest: Normal Hepatobiliary: Normal Pancreas: Normal Spleen: Normal Adrenals/Urinary Tract: Adrenal glands are normal. Kidneys are normal. No cyst, mass, stone or hydronephrosis. No passing stone. No stone in the bladder. Stomach/Bowel: Stomach and small intestine are normal. Previous appendectomy. No acute colon pathology. Vascular/Lymphatic: Normal Reproductive: Abnormal appearance of the adnexal regions. Previously, ultrasound suggested bilateral hydrosalpinx. This is not well characterized by CT. Consider repeat pelvic ultrasound for correlation. Other: No free fluid or air. Musculoskeletal: Normal IMPRESSION: 1. No evidence of urinary tract stone disease or other urinary tract pathology. 2. Previous appendectomy. 3. Abnormal appearance of the adnexal regions. Enlargement of the regions of the ovaries and fallopian tubes. Previously, ultrasound suggested bilateral hydrosalpinx. This is not well characterized by CT. Consider pelvic ultrasound for further characterization comparison with the prior exam . Electronically Signed   By: Paulina FusiMark  Shogry M.D.   On: 05/01/2020 13:25   US PELVIC COMPLETE WITH TRANSVAGINAL  Result Date: 05/01/2020 CLINICAL DATA:  Worsening pelvic pain and cramping with vaginal  bleeding. History of endometriosis. EXAM: TRANSABDOMINAL AND TRANSVAGINAL ULTRASOUND OF PELVIS TECHNIQUE: Both transabdominal and transvaginal ultrasound examinations of the pelvis were performed. Transabdominal technique was performed for global imaging of the pelvis including uterus, ovaries, adnexal regions, and pelvic cul-de-sac. It was necessary to proceed with endovaginal exam following the transabdominal exam  to visualize the endometrium. COMPARISON:  CT abdomen pelvis from same day. Pelvic ultrasound dated March 02, 2019. FINDINGS: Uterus Measurements: 6.9 x 3.3 x 4.8 cm = volume: 57 mL. No fibroids or other mass visualized. Endometrium Thickness: 4 mm.  No focal abnormality visualized. Right ovary Measurements: 3.8 x 2.3 x 2.4 cm = volume: 11.2 mL. There is a 4.7 x 4.2 x 4.7 cm anechoic tubular structure adjacent to the right ovary. This previously measured 3.7 x 2.8 x 2.4 cm in January 2021. Left ovary Measurements: 2.9 x 2.0 x 3.0 cm = volume: 8.9 mL. There is a 2.6 x 2.1 x 2.1 cm anechoic tubular structure adjacent to the left ovary. This previously measured 3.8 x 0.9 x 2.2 cm in January 2021. Other findings No abnormal free fluid. IMPRESSION: 1. Bilateral hydrosalpinx, slightly increased on the right since January 2021. 2. Normal uterus and ovaries. Electronically Signed   By: Obie Dredge M.D.   On: 05/01/2020 14:17    ____________________________________________   PROCEDURES and INTERVENTIONS  Procedure(s) performed (including Critical Care):  .1-3 Lead EKG Interpretation Performed by: Delton Prairie, MD Authorized by: Delton Prairie, MD     Interpretation: normal     ECG rate:  62   ECG rate assessment: normal     Rhythm: sinus rhythm     Ectopy: none     Conduction: normal      Medications  lactated ringers bolus 1,000 mL (1,000 mLs Intravenous New Bag/Given 05/01/20 1349)  ketorolac (TORADOL) 30 MG/ML injection 15 mg (15 mg Intravenous Given 05/01/20 1256)    ____________________________________________   MDM / ED COURSE   35 year old woman with history of bilateral hydrosalpinx chronically and endometriosis, just to the ED with acute on chronic lower abdominal discomfort and vaginal bleeding, ultimately amenable to outpatient management.  Normal vitals on room air.  Exam demonstrates poorly localizing lower abdominal tenderness to palpation without peritoneal features.  Benign  upper abdomen and she otherwise looks well without acute derangements beyond her lower abdominal discomfort.  No neurovascular deficits, distress or signs of trauma.  Blood work is reassuring with stable hemoglobin, no leukocytosis.  She has no urinary symptoms and urine has no infectious features to suggest UTI.  Crystals noted on UA, and she reports a remote history of kidney stones 1 time, so CT renal study performed demonstrates no ureterolithiasis or nephrolithiasis.  Due to her history of endometriosis and chronic hydrosalpinx, pelvic ultrasound performed and demonstrates persistent hydrosalpinx without ovarian or uterine pathology.  She has resolving symptoms with single dose of Toradol and I see no evidence of pathology to preclude outpatient management.  We discussed following up with OB/GYN as an outpatient and we discussed return precautions for the ED.  Provided prescriptions for Mobic and Zofran.  Patient stable for outpatient management.  Clinical Course as of 05/01/20 1445  Tue May 01, 2020  1440 Reassessed.  Patient reports feeling better and is requesting food to eat.  We discussed reassuring imaging.  We discussed following up with OB/GYN closely as an outpatient and we discussed return precautions for the ED. [DS]    Clinical Course User Index [DS] Delton Prairie, MD  ____________________________________________   FINAL CLINICAL IMPRESSION(S) / ED DIAGNOSES  Final diagnoses:  Vaginal bleeding  Hydrosalpinx     ED Discharge Orders         Ordered    meloxicam (MOBIC) 7.5 MG tablet  Daily PRN        05/01/20 1437    ondansetron (ZOFRAN ODT) 4 MG disintegrating tablet  Every 8 hours PRN        05/01/20 1437           Dylan Smith   Note:  This document was prepared using Conservation officer, historic buildings and may include unintentional dictation errors.   Delton Prairie, MD 05/01/20 [phone]

## 2020-05-01 NOTE — ED Triage Notes (Addendum)
Pt via POV from home. Pt states her normal menstrual cycle is normally 3 days. Pt states she has been having some vaginal bleeding for the past 10 days. Denies any clots at this point. Pt also c/o intermittent lower abdominal pain for the past 10 days. Pt has a hx of UTI. Denies any urinary symptoms. Denies NVD. Pt is A&Ox4 and NAD.

## 2020-05-01 NOTE — ED Notes (Signed)
Pt eating snacks at this time

## 2020-05-01 NOTE — ED Notes (Signed)
See triage note. Ambulatory to room. Pt c/o lower abd pain with vaginal bleeding exceeding her normal period.

## 2020-05-03 LAB — URINE CULTURE: Culture: 50000 — AB

## 2020-05-10 ENCOUNTER — Other Ambulatory Visit: Payer: Self-pay

## 2020-05-10 ENCOUNTER — Ambulatory Visit: Payer: Self-pay

## 2020-05-10 ENCOUNTER — Ambulatory Visit (LOCAL_COMMUNITY_HEALTH_CENTER): Payer: Self-pay | Admitting: Physician Assistant

## 2020-05-10 ENCOUNTER — Encounter: Payer: Self-pay | Admitting: Physician Assistant

## 2020-05-10 VITALS — BP 96/62 | Ht 64.0 in | Wt 202.0 lb

## 2020-05-10 DIAGNOSIS — Z1331 Encounter for screening for depression: Secondary | ICD-10-CM

## 2020-05-10 DIAGNOSIS — Z3009 Encounter for other general counseling and advice on contraception: Secondary | ICD-10-CM

## 2020-05-10 DIAGNOSIS — A64 Unspecified sexually transmitted disease: Secondary | ICD-10-CM

## 2020-05-10 DIAGNOSIS — Z720 Tobacco use: Secondary | ICD-10-CM

## 2020-05-10 DIAGNOSIS — Z6834 Body mass index (BMI) 34.0-34.9, adult: Secondary | ICD-10-CM | POA: Insufficient documentation

## 2020-05-10 DIAGNOSIS — Z01419 Encounter for gynecological examination (general) (routine) without abnormal findings: Secondary | ICD-10-CM

## 2020-05-10 LAB — WET PREP FOR TRICH, YEAST, CLUE
Trichomonas Exam: NEGATIVE
Yeast Exam: NEGATIVE

## 2020-05-10 MED ORDER — METRONIDAZOLE 500 MG PO TABS: 500.0000 mg | ORAL_TABLET | Freq: Two times a day (BID) | ORAL | 0 refills | Status: AC

## 2020-05-10 NOTE — Progress Notes (Signed)
[facility] DEPARTMENT [facility] [address] Main Number: [phone]    Family Planning Visit- Initial Visit  Subjective:  Wendy Proctor is a 35 y.o.  [id number]   being seen today for an initial annual visit and to discuss contraceptive options.  The patient is currently using None for pregnancy prevention. Patient reports she does want a pregnancy in the next year.  Patient has the following medical conditions has BMI 34.0-34.9,adult on their problem list.  Chief Complaint  Patient presents with  . Gynecologic Exam    Physical and birth control    Patient reports recent diagnosis of bilat hydrosalpinx, still having lower abdominal/pelvic pain R/L. Requests STI screen.  Patient denies other concerns.   Body mass index is 34.67 kg/m. - Patient is eligible for diabetes screening based on BMI and age >26?  no HA1C ordered? not applicable  Patient reports 2  partner/s in last year. Desires STI screening?  Yes  Has patient been screened once for HCV in the past?  No  No results found for: HCVAB  Does the patient have current drug use (including MJ), have a partner with drug use, and/or has been incarcerated since last result? Yes  If yes-- Screen for HCV through [facility]   Does the patient meet criteria for HBV testing? Yes  Criteria:  -Household, sexual or needle sharing contact with HBV -History of drug use -HIV positive -Those with known Hep C   Health Maintenance Due  Topic Date Due  . Hepatitis C Screening  Never done  . COVID-19 Vaccine (1) Never done  . HIV Screening  Never done  . TETANUS/TDAP  Never done  . PAP SMEAR-Modifier  Never done  . INFLUENZA VACCINE  Never done    Review of Systems  Constitutional: Negative.   HENT: Negative.   Eyes: Positive for blurred vision. Negative for double vision, photophobia, pain, discharge and redness.  Respiratory: Negative for cough, hemoptysis, sputum production, shortness  of breath and wheezing.   Cardiovascular: Positive for leg swelling. Negative for chest pain, palpitations, orthopnea, claudication and PND.  Gastrointestinal: Positive for abdominal pain and nausea. Negative for blood in stool, constipation, diarrhea, heartburn, melena and vomiting.  Genitourinary: Negative for dysuria, flank pain, frequency, hematuria and urgency.  Musculoskeletal: Positive for joint pain. Negative for back pain, falls, myalgias and neck pain.  Skin: Negative.   Neurological: Positive for headaches. Negative for dizziness, tingling, tremors, sensory change, speech change, focal weakness, seizures, loss of consciousness and weakness.  Endo/Heme/Allergies: Negative for environmental allergies and polydipsia. Bruises/bleeds easily.  Psychiatric/Behavioral: Positive for depression and substance abuse. Negative for hallucinations, memory loss and suicidal ideas. The patient is not nervous/anxious and does not have insomnia.     The following portions of the patient's history were reviewed and updated as appropriate: allergies, current medications, past family history, past medical history, past social history, past surgical history and problem list. Problem list updated.   See flowsheet for other program required questions.  Objective:   Vitals:   05/10/20 1554  BP: 96/62  Weight: 202 lb (91.6 kg)  Height: 5\' 4"  (1.626 m)    Physical Exam Constitutional:      Appearance: Normal appearance. She is obese.  HENT:     Head: Normocephalic and atraumatic.  Neck:     Comments: No thyromegaly, LAD or cervical mass. Pulmonary:     Effort: Pulmonary effort is normal.  Chest:  Breasts:     Tanner  Score is 5.     Right: Normal. No axillary adenopathy.     Left: Normal. No axillary adenopathy.    Abdominal:     Palpations: Abdomen is soft.  Genitourinary:    General: Normal vulva.     Exam position: Lithotomy position.     Pubic Area: No rash.      Labia:         Right: No tenderness or lesion.        Left: No tenderness or lesion.      Vagina: Vaginal discharge present.     Cervix: No friability or erythema.     Uterus: Not tender.      Adnexa:        Right: Tenderness present.        Left: Tenderness present.      Rectum: No external hemorrhoid.     Comments: Scattered pustules, inner thighs Mod amt malodorous vag discharge, pH >4.5. Musculoskeletal:        General: Normal range of motion.  Lymphadenopathy:     Upper Body:     Right upper body: No axillary adenopathy.     Left upper body: No axillary adenopathy.     Lower Body: No right inguinal adenopathy. No left inguinal adenopathy.  Skin:    General: Skin is warm and dry.  Neurological:     General: No focal deficit present.     Mental Status: She is alert.  Psychiatric:        Mood and Affect: Mood normal.        Behavior: Behavior normal.    Assessment and Plan:  Wendy Proctor is a 35 y.o. female presenting to the [facility] Department for an initial annual wellness/contraceptive visit  Contraception counseling: Reviewed all forms of birth control options in the tiered based approach. available including abstinence; over the counter/barrier methods; hormonal contraceptive medication including pill, patch, ring, injection,contraceptive implant, ECP; hormonal and nonhormonal IUDs; permanent sterilization options including vasectomy and the various tubal sterilization modalities. Risks, benefits, and typical effectiveness rates were reviewed.  Questions were answered.  Written information was also given to the patient to review.  Patient desires no method. She will follow up in  12 mo for surveillance.  She was told to call with any further questions, or with any concerns about this method of contraception.  Emphasized use of condoms 100% of the time for STI prevention.   1. Family planning services Pt considering pregnancy - enc to take MVI with folic acid qd, and  discontinue all alcohol, tobacco and marijuana use.  2. Well woman exam with routine gynecological exam Enc to establish PCP for primary care services and evaluate multiple issues noted on ROS. - WET PREP FOR TRICH, YEAST, CLUE - Chlamydia/Gonorrhea [place] [facility] - IGP, Aptima HPV  3. BMI 34.0-34.9,adult Enc diet/exercise.  4. Tobacco abuse [place] Quitline # given.  5. Positive depression screening PHQ-2 pos, PHQ-9 = 15, no SI/HI. Desires counseling. - Ambulatory referral to Behavioral Health  6. Bacterial Vaginosis Treat per S.O. - metroNIDAZOLE (FLAGYL) 500 MG tablet; Take 1 tablet (500 mg total) by mouth 2 (two) times daily for 7 days.  Dispense: 14 tablet; Refill: 0    Return in about 1 year (around 05/10/2021) for Annual well-woman exam.  No future appointments.  Landry Dyke, PA-C

## 2020-05-10 NOTE — Progress Notes (Signed)
Patient here for physical, and still having discomfort from ED visit 05/01/2020 diagnosed with hydrospinx.   Patient filling out PHQ9. Kathreen Cosier LCSW card given, Quitline card given, PCP list.   Harvie Heck, RN   RN reviewed wet mount with patient. Patient treated for trich per provider orders. Provider orders complete.   Harvie Heck, RN

## 2020-05-15 LAB — IGP, APTIMA HPV
HPV Aptima: NEGATIVE
PAP Smear Comment: 0

## 2020-05-24 ENCOUNTER — Ambulatory Visit: Payer: Self-pay | Admitting: Licensed Clinical Social Worker
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
## 2020-08-21 ENCOUNTER — Encounter: Payer: Self-pay | Admitting: Nurse Practitioner

## 2020-08-21 NOTE — Progress Notes (Signed)
Telephone call to patient regarding repeat PAP.  Unsatisfactory PAP 04/2020.  No answer, left message to call. Glenna Fellows, RN

## 2020-09-06 ENCOUNTER — Emergency Department
Admission: EM | Admit: 2020-09-06 | Discharge: 2020-09-06 | Disposition: A | Discharge status: Home / Self Care | Payer: Self-pay | Attending: Emergency Medicine | Admitting: Emergency Medicine

## 2020-09-06 ENCOUNTER — Other Ambulatory Visit: Payer: Self-pay

## 2020-09-06 DIAGNOSIS — K047 Periapical abscess without sinus: Secondary | ICD-10-CM

## 2020-09-06 DIAGNOSIS — F17210 Nicotine dependence, cigarettes, uncomplicated: Secondary | ICD-10-CM | POA: Insufficient documentation

## 2020-09-06 DIAGNOSIS — J45909 Unspecified asthma, uncomplicated: Secondary | ICD-10-CM | POA: Insufficient documentation

## 2020-09-06 MED ORDER — AMOXICILLIN 875 MG PO TABS: 875.0000 mg | ORAL_TABLET | Freq: Two times a day (BID) | ORAL | 0 refills | Status: AC

## 2020-09-06 MED ORDER — ALBUTEROL SULFATE HFA 108 (90 BASE) MCG/ACT IN AERS: 2.0000 | INHALATION_SPRAY | Freq: Four times a day (QID) | RESPIRATORY_TRACT | 0 refills | Status: AC | PRN

## 2020-09-06 NOTE — ED Provider Notes (Addendum)
[facility] Emergency Department Provider Note  ____________________________________________   Event Date/Time   First MD Initiated Contact with Patient 09/06/20 [phone]     (approximate)  I have reviewed the triage vital signs and the nursing notes.   HISTORY  Chief Complaint Dental Pain   HPI Wendy Proctor is a 35 y.o. female presents to the ED with complaint of bottom front tooth being loose and causing pain.  Patient states that she has multiple dental problems but does not have the money to see a dentist.  Patient is currently a smoker and also needs an albuterol inhaler as she has lost hers.  Patient is also requesting a work note as she missed work yesterday due to her dental pain.  Rates her pain as an 8 out of 10.       Past Medical History:  Diagnosis Date   Anxiety    Asthma    Bronchitis    Depression    Migraines    PTSD (post-traumatic stress disorder)     Patient Active Problem List   Diagnosis Date Noted   BMI 34.0-34.9,adult 05/10/2020    Past Surgical History:  Procedure Laterality Date   APPENDECTOMY     TONSILLECTOMY      Prior to Admission medications   Medication Sig Start Date End Date Taking? Authorizing Provider  albuterol (VENTOLIN HFA) 108 (90 Base) MCG/ACT inhaler Inhale 2 puffs into the lungs every 6 (six) hours as needed for wheezing or shortness of breath. 09/06/20  Yes Tommi Rumps, PA-C  amoxicillin (AMOXIL) 875 MG tablet Take 1 tablet (875 mg total) by mouth 2 (two) times daily. 09/06/20  Yes Tommi Rumps, PA-C    Allergies Patient has no known allergies.  Family History  Problem Relation Age of Onset   Hypertension Paternal Grandmother    Diabetes Paternal Grandmother    Hypertension Maternal Grandmother    Diabetes Maternal Grandmother    Skin cancer Maternal Grandfather    Diabetes Mother    Cerebral palsy Brother    Lung disease Maternal Uncle     Social History Social History    Tobacco Use   Smoking status: Every Day    Packs/day: 0.50    Types: Cigarettes   Smokeless tobacco: Never  Vaping Use   Vaping Use: Some days  Substance Use Topics   Alcohol use: Never   Drug use: Yes    Types: Marijuana    Review of Systems Constitutional: No fever/chills Eyes: No visual changes. ENT: No sore throat.  Positive for dental pain. Cardiovascular: Denies chest pain. Respiratory: Denies shortness of breath. Gastrointestinal: No abdominal pain.  No nausea, no vomiting.   Musculoskeletal: Negative for musculoskeletal pain. Skin: Negative for rash. Neurological: Negative for headaches, focal weakness or numbness.  ____________________________________________   PHYSICAL EXAM:  VITAL SIGNS: ED Triage Vitals  Enc Vitals Group     BP 09/06/20 0730 117/82     Pulse Rate 09/06/20 0730 74     Resp 09/06/20 0730 18     Temp 09/06/20 0730 98.2 F (36.8 C)     Temp Source 09/06/20 0730 Oral     SpO2 --      Weight 09/06/20 0736 201 lb 15.1 oz (91.6 kg)     Height 09/06/20 0736 5\' 4"  (1.626 m)     Head Circumference --      Peak Flow --      Pain Score 09/06/20 0729 8  Pain Loc --      Pain Edu? --      Excl. in GC? --     Constitutional: Alert and oriented. Well appearing and in no acute distress. Eyes: Conjunctivae are normal. PERRL. EOMI. Head: Atraumatic. Nose: No congestion/rhinnorhea. Mouth/Throat: Mucous membranes are moist.  Oropharynx non-erythematous.  Numerous areas of poor dental hygiene and repair.  Left lower central incisor is loose.  No visible drainage noted. Neck: No stridor.   Cardiovascular: Normal rate, regular rhythm. Grossly normal heart sounds.  Good peripheral circulation. Respiratory: Normal respiratory effort.  No retractions. Lungs CTAB. Musculoskeletal: No lower extremity tenderness nor edema.  No joint effusions. Neurologic:  Normal speech and language. No gross focal neurologic deficits are appreciated. No gait  instability. Skin:  Skin is warm, dry and intact. No rash noted. Psychiatric: Mood and affect are normal. Speech and behavior are normal.  ____________________________________________   LABS (all labs ordered are listed, but only abnormal results are displayed)  Labs Reviewed - No data to display ____________________________________________  PROCEDURES  Procedure(s) performed (including Critical Care):  Procedures   ____________________________________________   INITIAL IMPRESSION / ASSESSMENT AND PLAN / ED COURSE  As part of my medical decision making, I reviewed the following data within the electronic MEDICAL RECORD NUMBER Notes from prior ED visits and [place] Controlled Substance Database  35 year old female presents to the ED with dental pain and need for both work note and a refill on her albuterol inhaler.  Patient was given list of dental clinics in the area based on income that she may set up an appointment with.  A prescription for amoxicillin was sent to her pharmacy along with a refill for an albuterol inhaler.  She is encouraged to discontinue smoking which may decrease the need for her use of albuterol. ____________________________________________   FINAL CLINICAL IMPRESSION(S) / ED DIAGNOSES  Final diagnoses:  Dental abscess     ED Discharge Orders          Ordered    amoxicillin (AMOXIL) 875 MG tablet  2 times daily        09/06/20 0741    albuterol (VENTOLIN HFA) 108 (90 Base) MCG/ACT inhaler  Every 6 hours PRN        09/06/20 0741             Note:  This document was prepared using Dragon voice recognition software and may include unintentional dictation errors.    Tommi Rumps, PA-C 09/06/20 0752    Tommi Rumps, PA-C 09/06/20 4540    Minna Antis, MD 09/06/20 1446

## 2020-09-06 NOTE — ED Triage Notes (Signed)
Pt c/o 1 top and 1 bottom tooth loose and causing pain, pt proceeds to states she is homeless and is asking for some thing eat and drink

## 2020-09-06 NOTE — ED Notes (Signed)
See triage note  Presents with dental pain to lower   States her tooth is loose

## 2020-10-26 ENCOUNTER — Encounter: Payer: Self-pay | Admitting: Nurse Practitioner

## 2020-10-26 NOTE — Progress Notes (Signed)
Telephone call to patient regarding appointment to repeat PAP.  Telephone number currently not in service and no address listed in demographics.  Glenna Fellows, RN

## 2022-01-06 IMAGING — US US PELVIS COMPLETE WITH TRANSVAGINAL
1 series · 13 of 25 positions shown · non-contrast
Comparison: None

CLINICAL DATA: Spotting, pelvic pain. Severe cramping. Negative
urine pregnancy test.

EXAM:
TRANSABDOMINAL AND TRANSVAGINAL ULTRASOUND OF PELVIS
TECHNIQUE: Both transabdominal and transvaginal ultrasound examinations of the
pelvis were performed. Transabdominal technique was performed for
global imaging of the pelvis including uterus, ovaries, adnexal
regions, and pelvic cul-de-sac. It was necessary to proceed with
endovaginal exam following the transabdominal exam to visualize the
endometrium and ovaries.

[Series 1: us pelvis complete with transvaginal · 113 acquisitions, 13 frames shown]
[im 1/113]
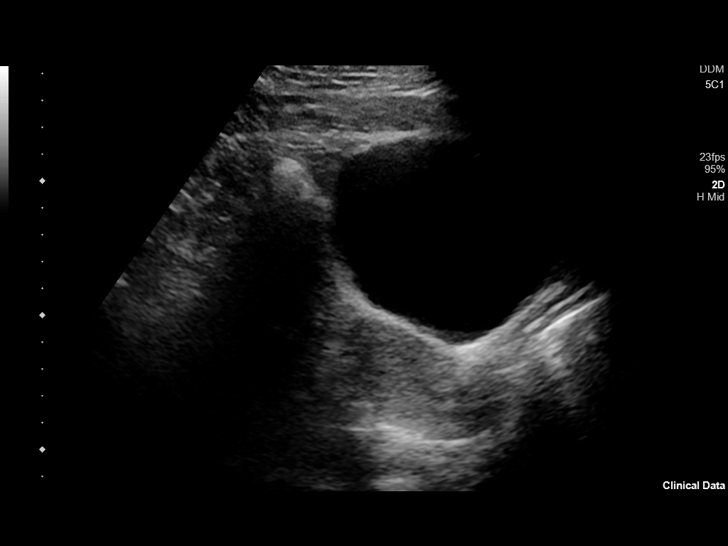
[im 10/113]
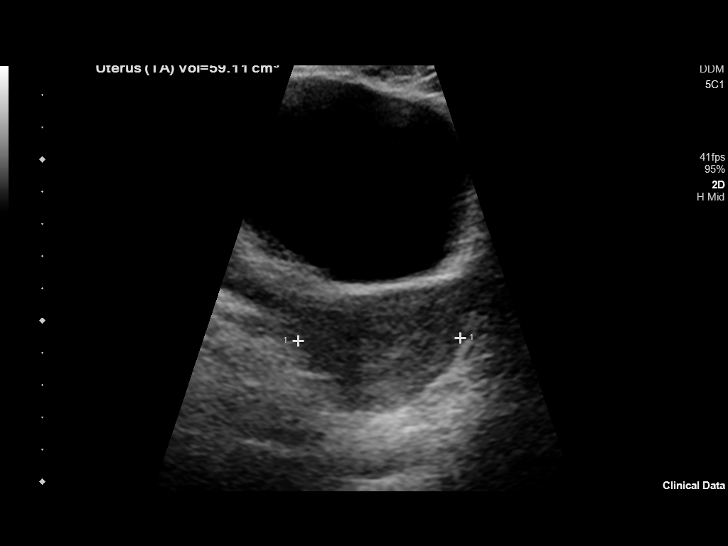
[im 19/113]
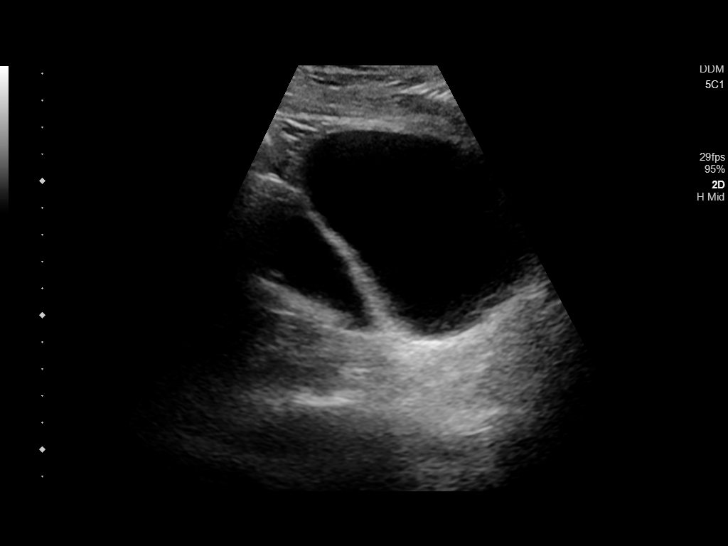
[im 29/113]
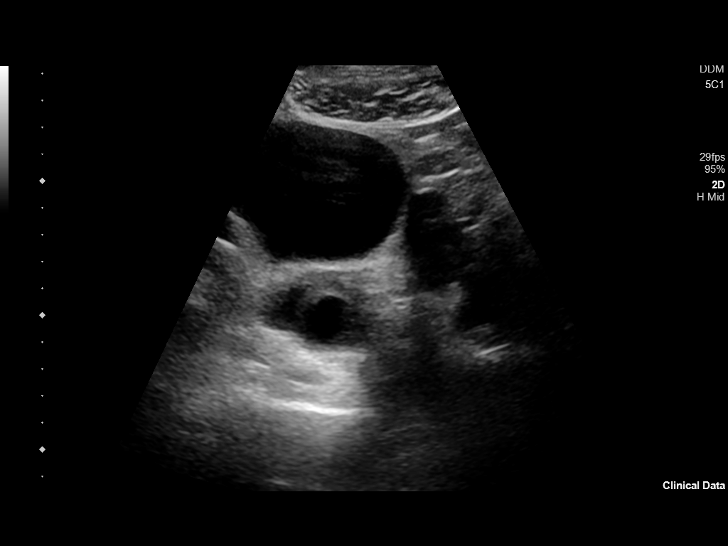
[im 38/113]
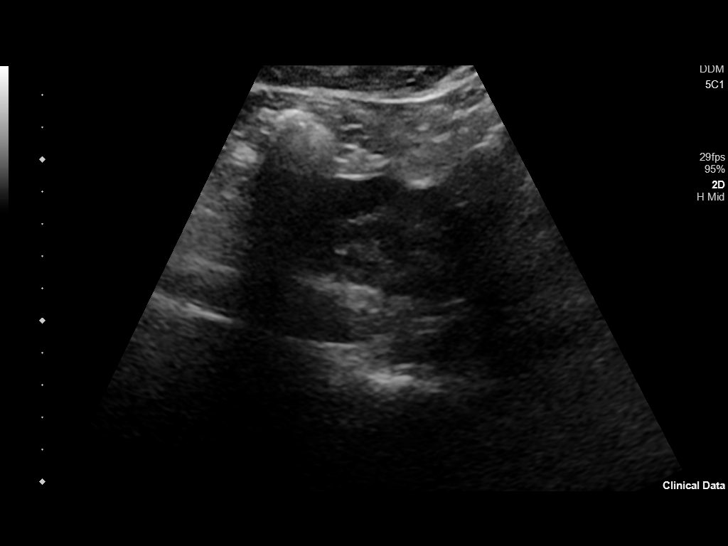
[im 47/113]
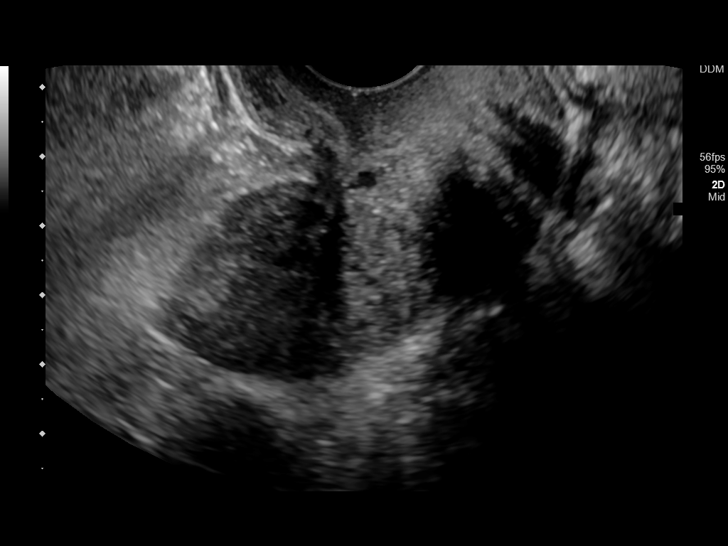
[im 57/113]
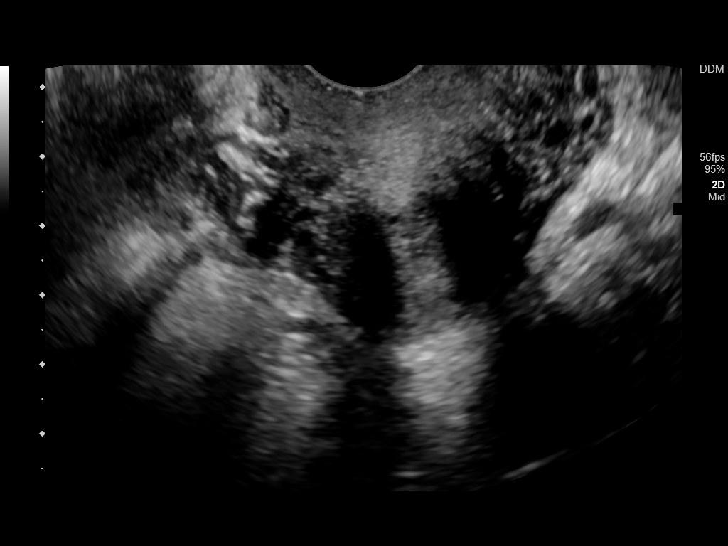
[im 66/113]
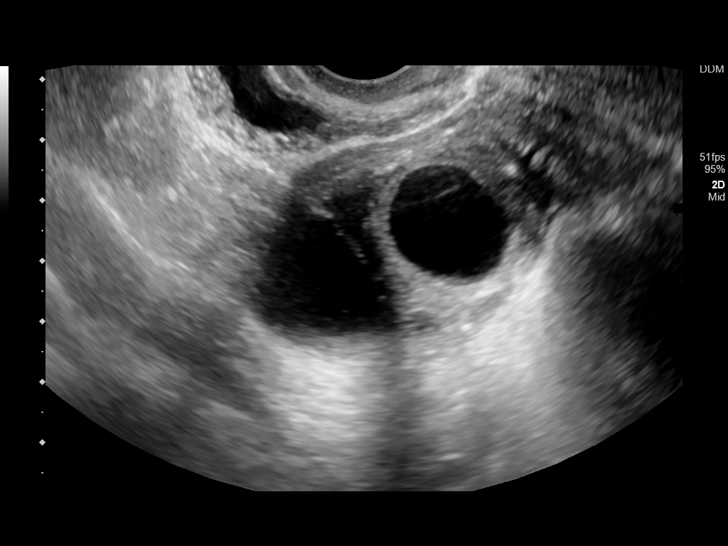
[im 75/113]
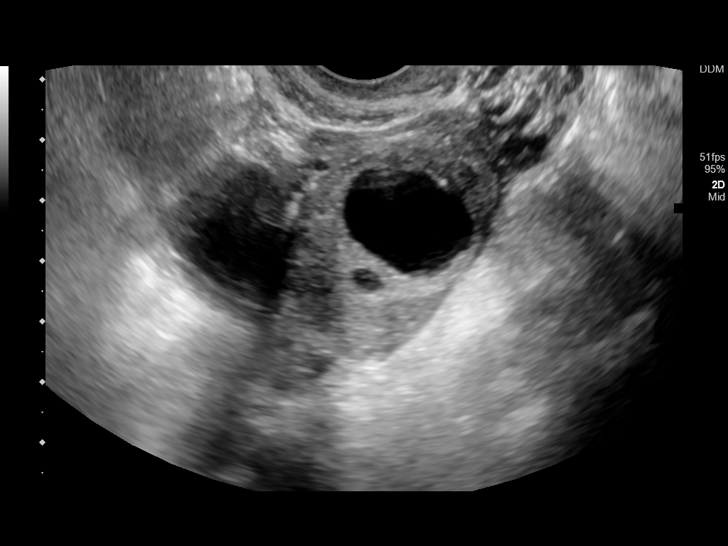
[im 85/113]
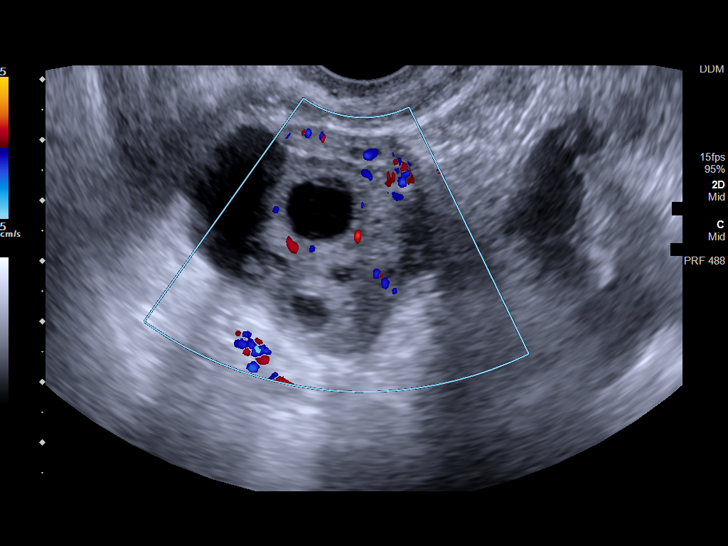
[im 94/113]
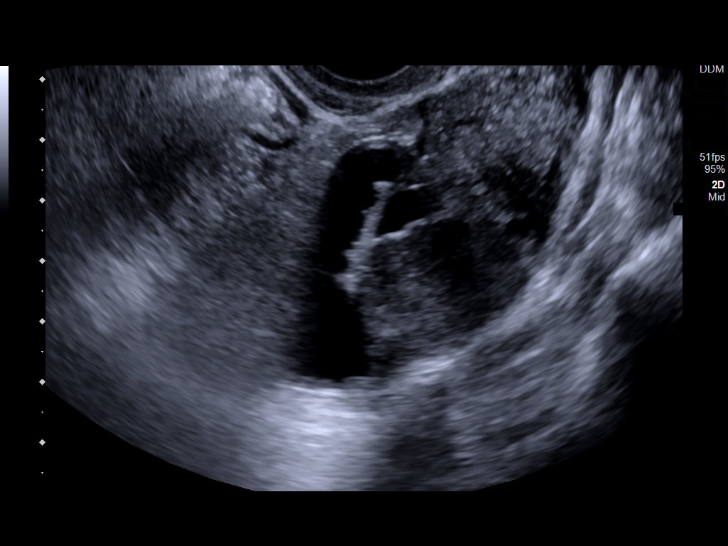
[im 103/113]
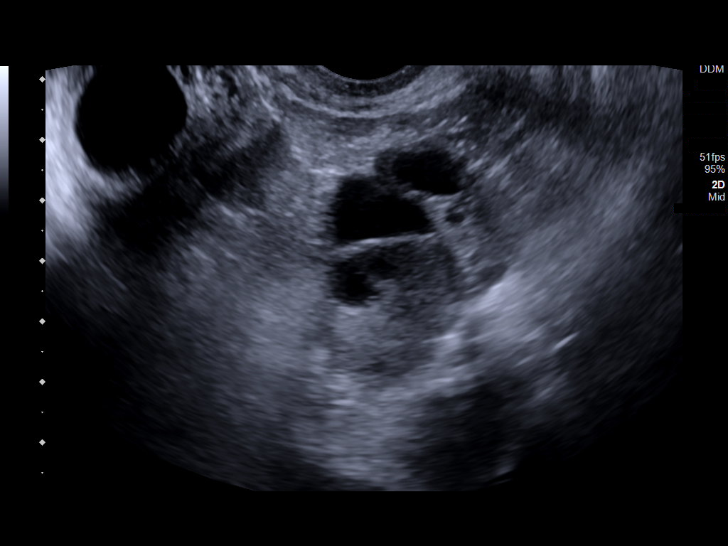
[im 113/113]
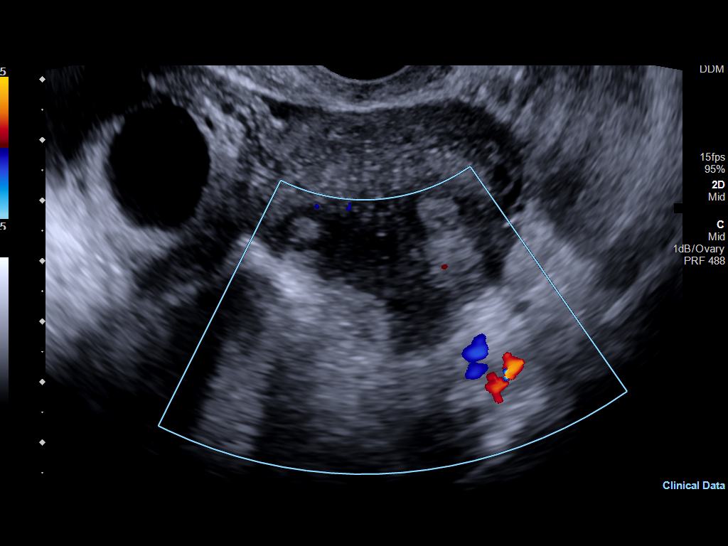

[13 of 25 positions shown; findings below may reference images not displayed]

FINDINGS: Uterus

Measurements: 6.6 x 3.4 x 5.0 cm = volume: 59 mL. No fibroids or
other mass visualized.

Endometrium

Thickness: 7 mm. Normal thickness for premenopausal female. No
focal abnormality visualized.

Right ovary

Measurements: 4.0 x 2.9 x 2.8 cm = volume: 17.2 mL. Dominant
follicle in the RIGHT ovary. Adjacent to the RIGHT ovary is a
anechoic tubular structure suggesting fluid in a dilated fallopian
tube. Structure measures 3.7 x 2.8 x 2.4 cm.

Left ovary

Measurements: 5.1 x 3.2 x 2.6 cm = volume: 22 mL. Serpiginous
low-attenuation structures suggesting dilated fallopian tube.
Tubular structure measures 3.8 x 0.9 x 2.2 cm.

Other findings

No abnormal free fluid.
IMPRESSION: 1. Normal uterus and ovaries.
2. Bilateral expanded tubular fluid collections suggest bilateral
hydrosalpinx. Recommend nonemergent gyn consultation and potential
pelvic MRI evaluation.

## 2023-03-08 IMAGING — US US PELVIS COMPLETE WITH TRANSVAGINAL
1 series · 13 of 25 positions shown · non-contrast
Comparison: CT abdomen pelvis from same day. Pelvic ultrasound
dated March 02, 2019.

CLINICAL DATA: Worsening pelvic pain and cramping with vaginal
bleeding. History of endometriosis.



[Series 1: us pelvic complete with transvaginal · 78 acquisitions, 13 frames shown]
[im 1/78]
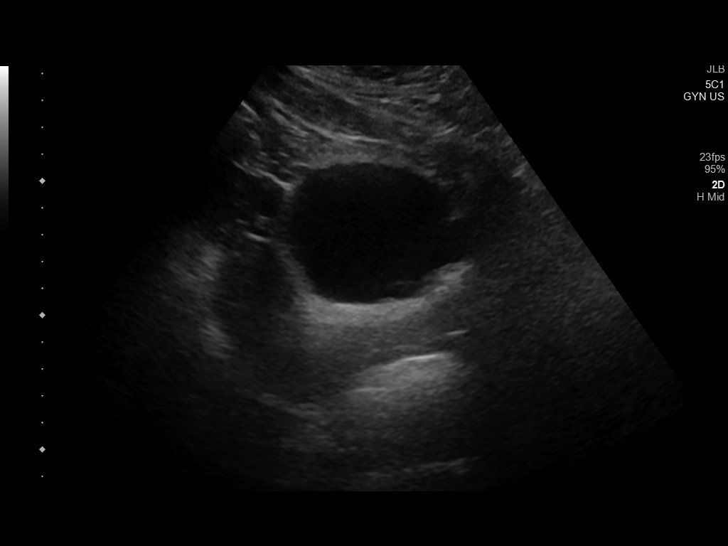
[im 7/78]
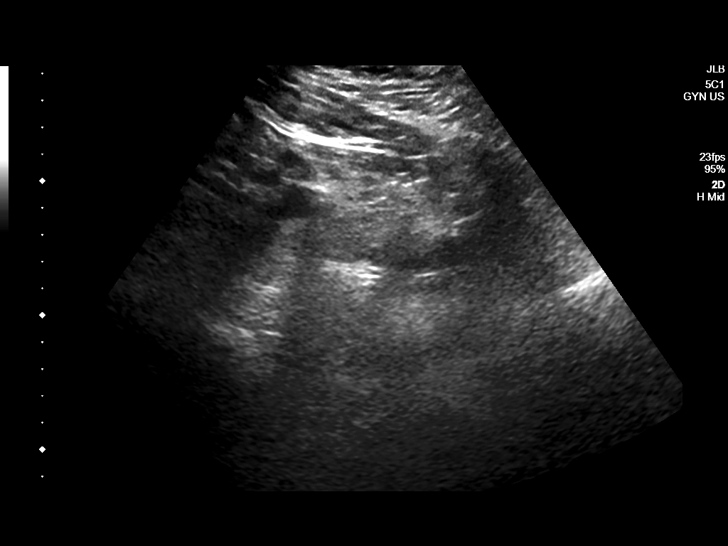
[im 13/78]
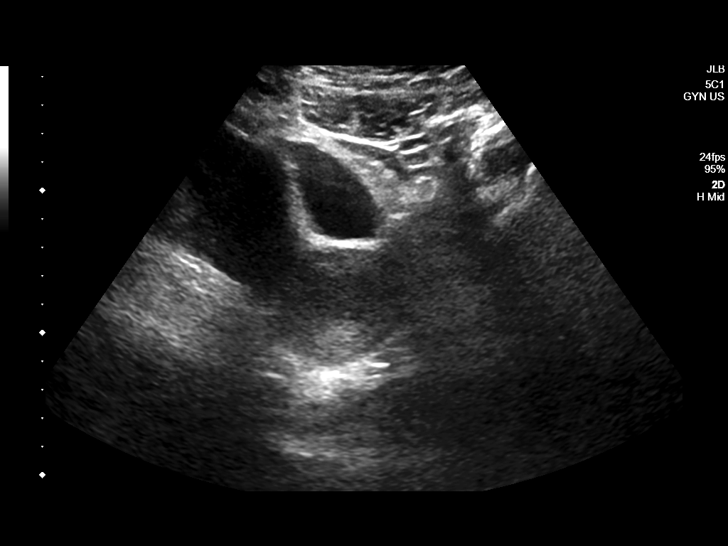
[im 20/78]
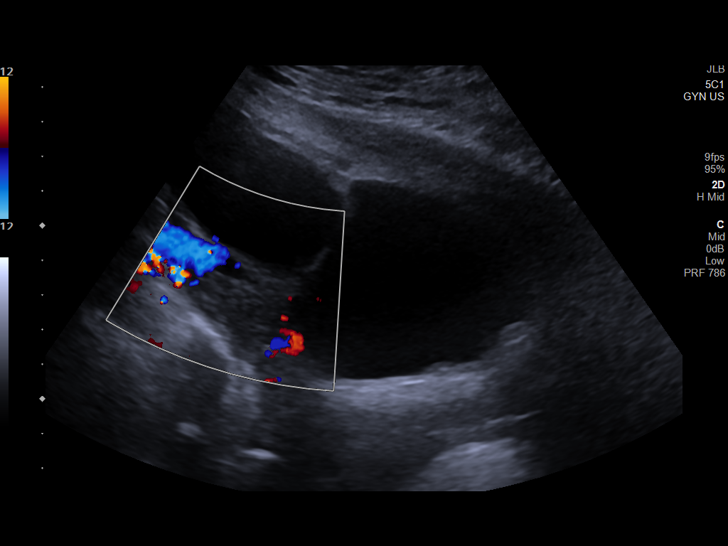
[im 26/78]
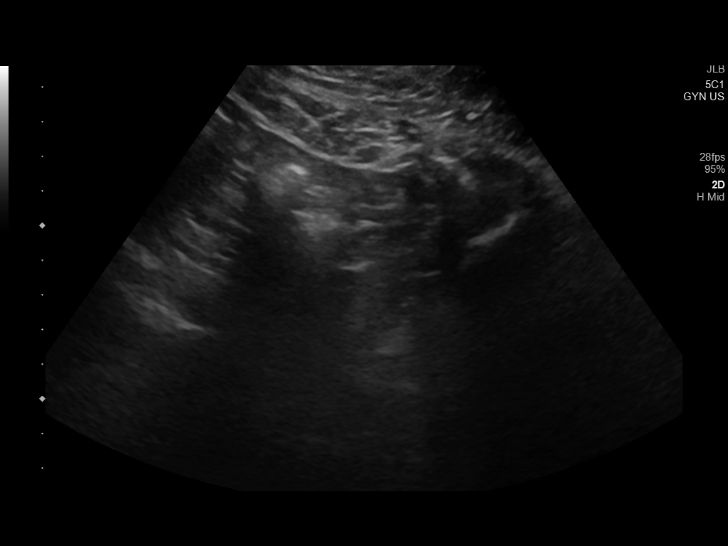
[im 33/78]
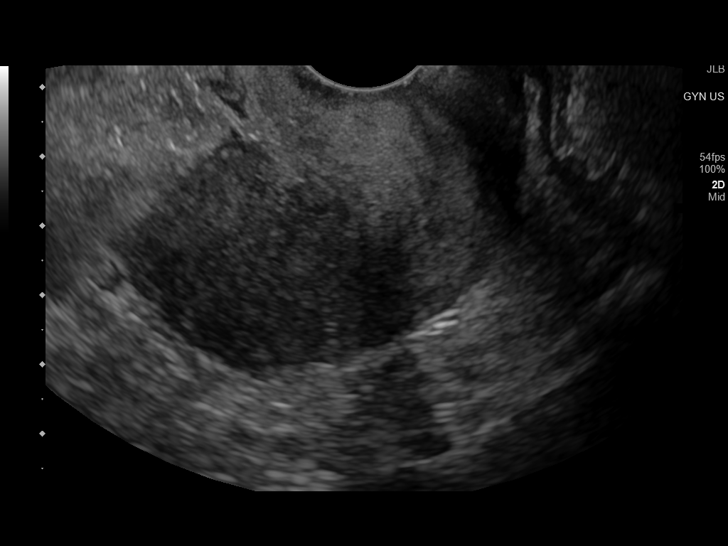
[im 39/78]
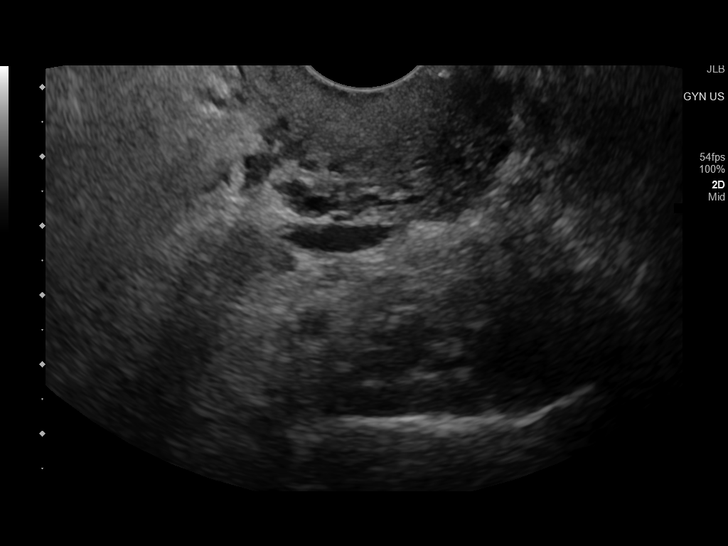
[im 45/78]
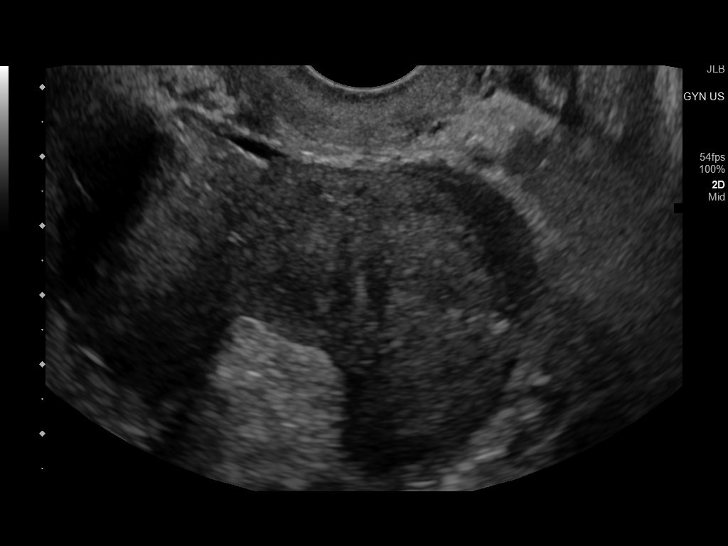
[im 52/78]
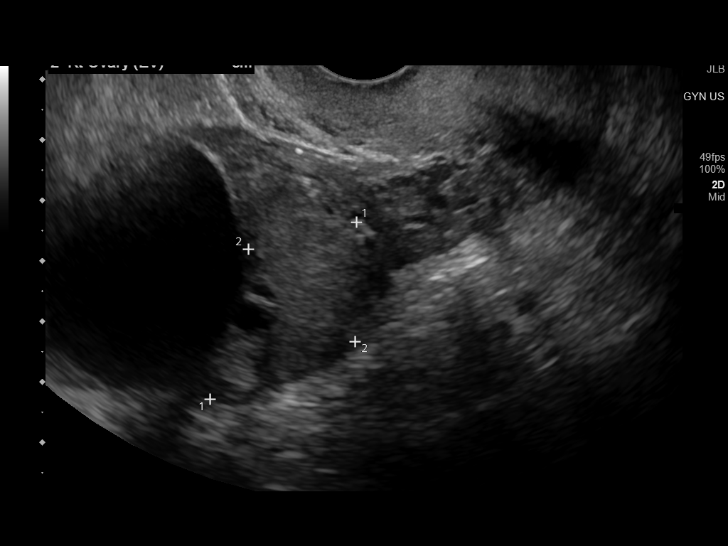
[im 58/78]
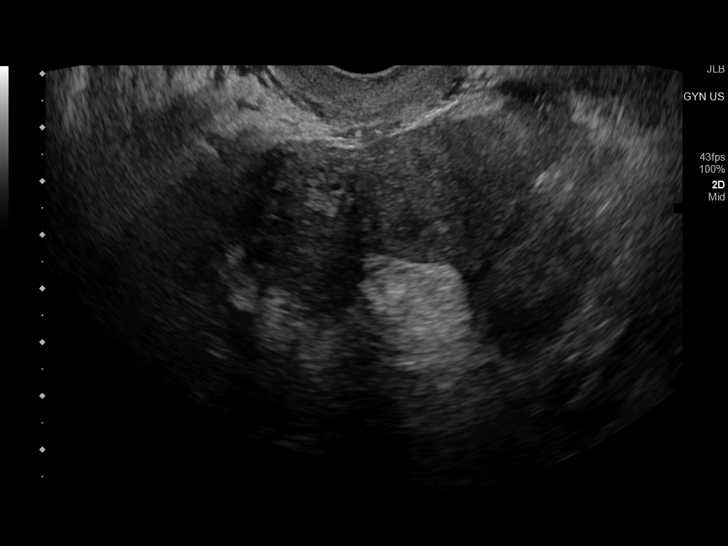
[im 65/78]
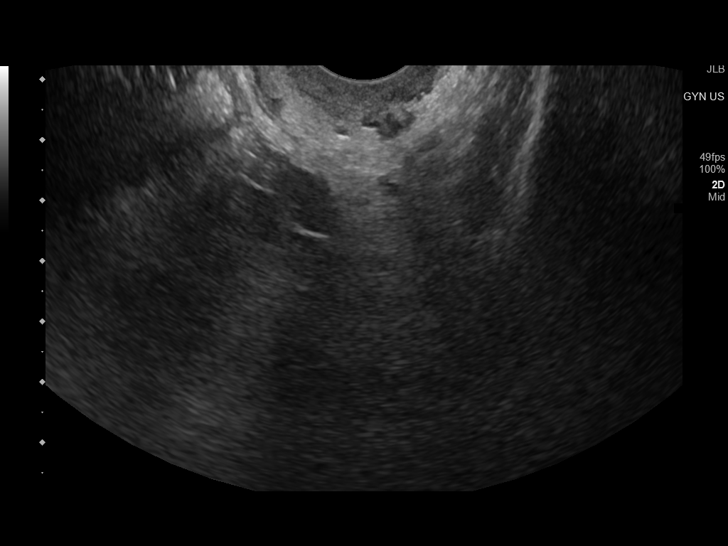
[im 71/78]
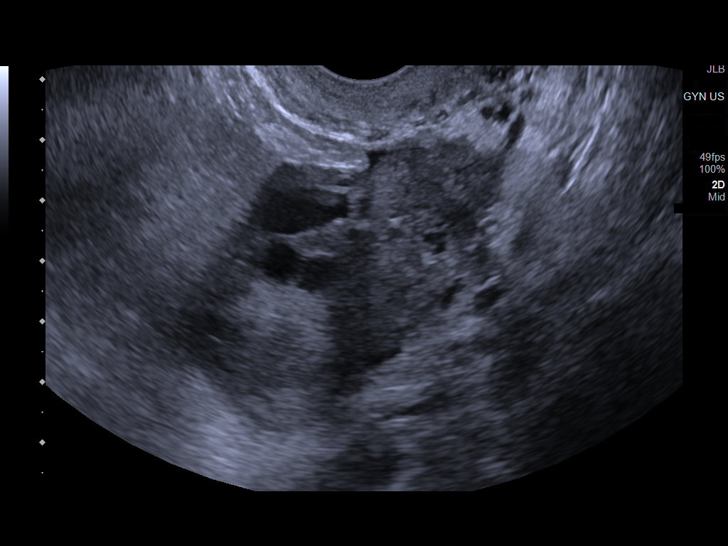
[im 78/78]
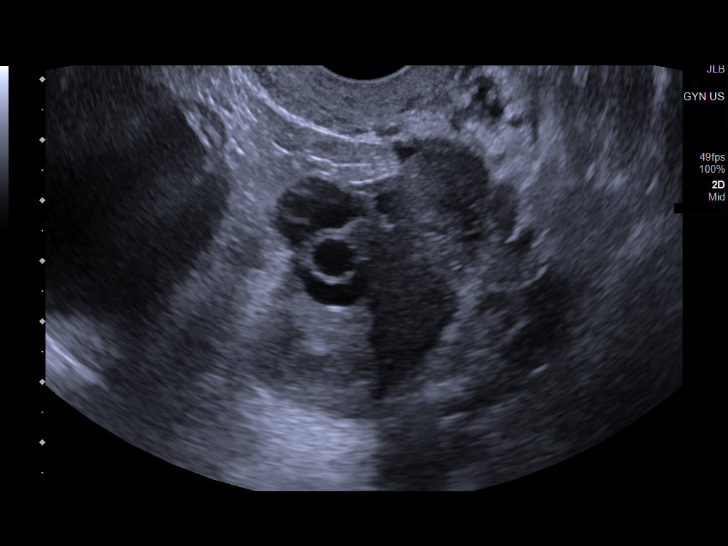

[13 of 25 positions shown; findings below may reference images not displayed]

FINDINGS: Uterus

Measurements: 6.9 x 3.3 x 4.8 cm = volume: 57 mL. No fibroids or
other mass visualized.

Endometrium

Thickness: 4 mm.  No focal abnormality visualized.

Right ovary

Measurements: 3.8 x 2.3 x 2.4 cm = volume: 11.2 mL. There is a 4.7 x
4.2 x 4.7 cm anechoic tubular structure adjacent to the right ovary.
This previously measured 3.7 x 2.8 x 2.4 cm in February 2019.

Left ovary

Measurements: 2.9 x 2.0 x 3.0 cm = volume: 8.9 mL. There is a 2.6 x
2.1 x 2.1 cm anechoic tubular structure adjacent to the left ovary.
This previously measured 3.8 x 0.9 x 2.2 cm in February 2019.

Other findings

No abnormal free fluid.
IMPRESSION: 1. Bilateral hydrosalpinx, slightly increased on the right since
February 2019. Normal uterus and ovaries.

## 2023-03-08 IMAGING — CT CT RENAL STONE PROTOCOL
2 of 4 series · 16 of 46 positions shown, 18 images · non-contrast
Comparison: Pelvic ultrasound 03/02/2019.

CLINICAL DATA: Right-sided flank pain.  Question kidney stone

EXAM:
CT ABDOMEN AND PELVIS WITHOUT CONTRAST
TECHNIQUE: Multidetector CT imaging of the abdomen and pelvis was performed
following the standard protocol without IV contrast.

[Series 2: stone full standard · axial · 0.70mm/px · z∈[-586,-171]mm · 13 of 91 slices shown, 15 images]
[im 4/91  soft-tissue]
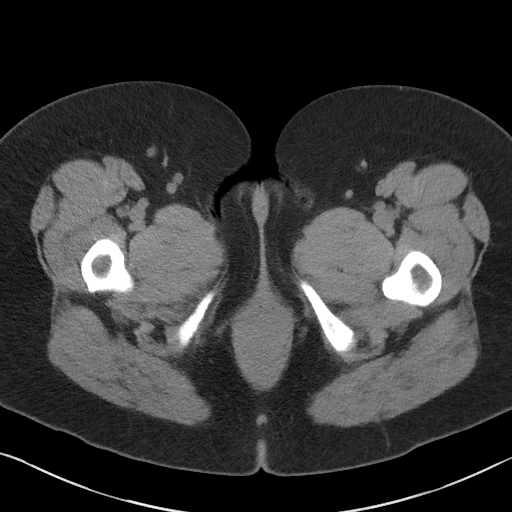
[im 4/91  bone]
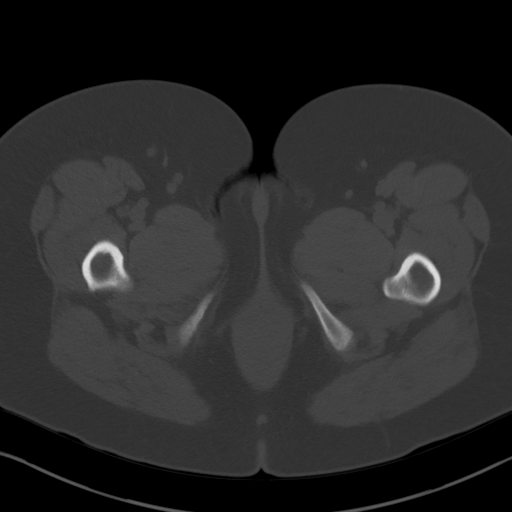
[im 12/91  soft-tissue]
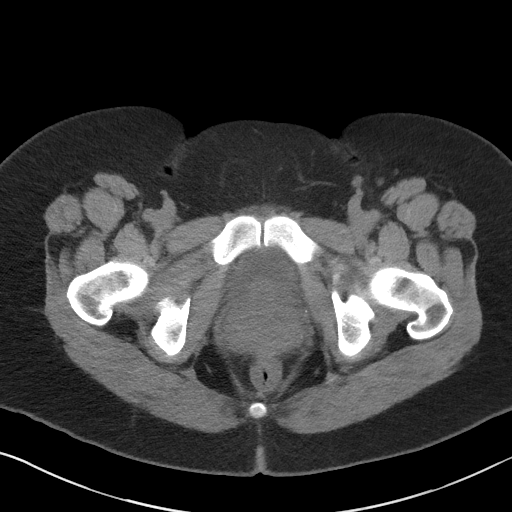
[im 20/91  soft-tissue]
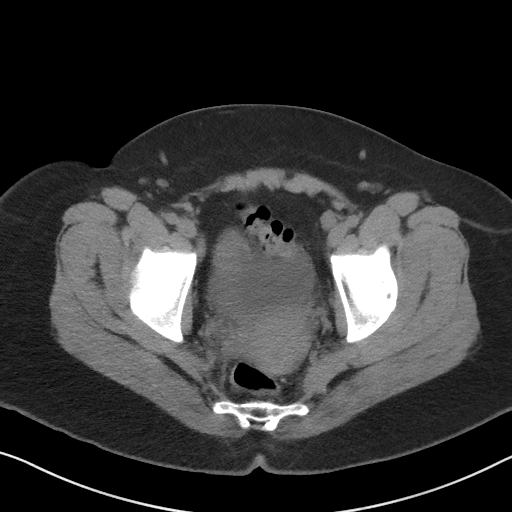
[im 24/91  soft-tissue]
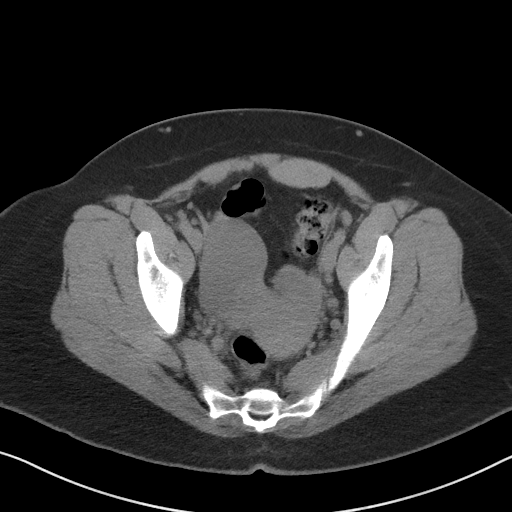
[im 32/91  soft-tissue]
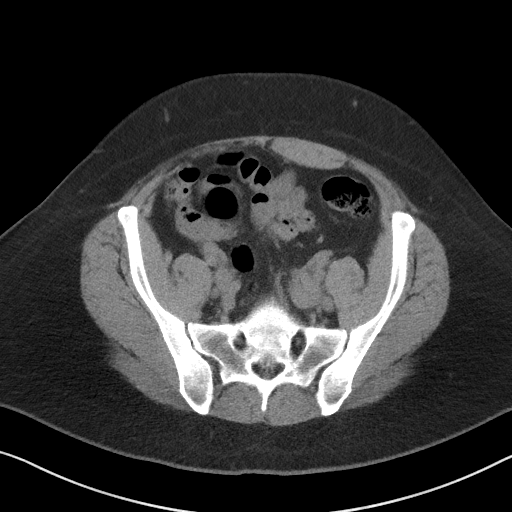
[im 40/91  soft-tissue]
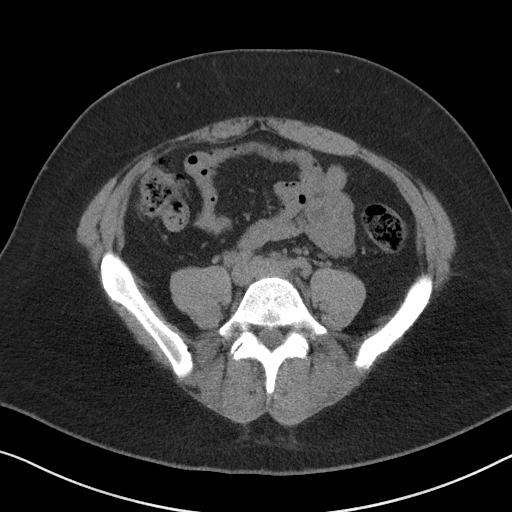
[im 47/91  soft-tissue]
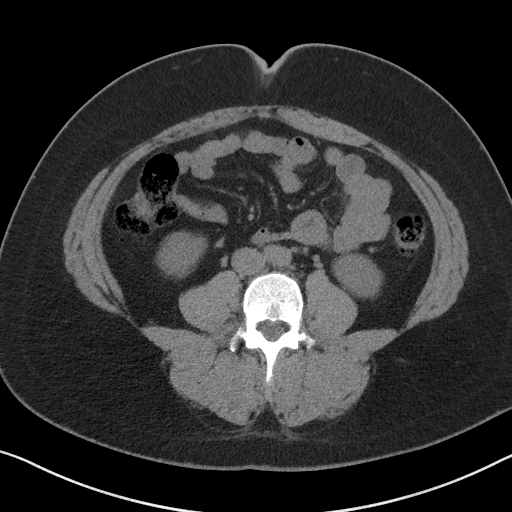
[im 51/91  soft-tissue]
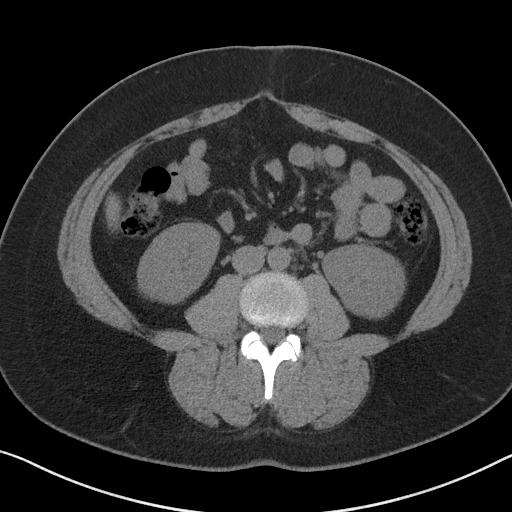
[im 59/91  soft-tissue]
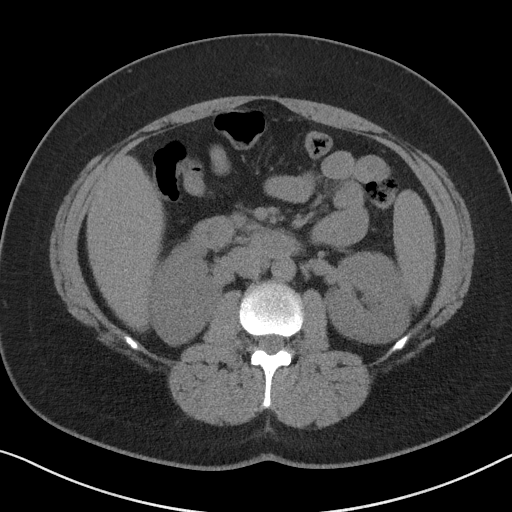
[im 59/91  bone]
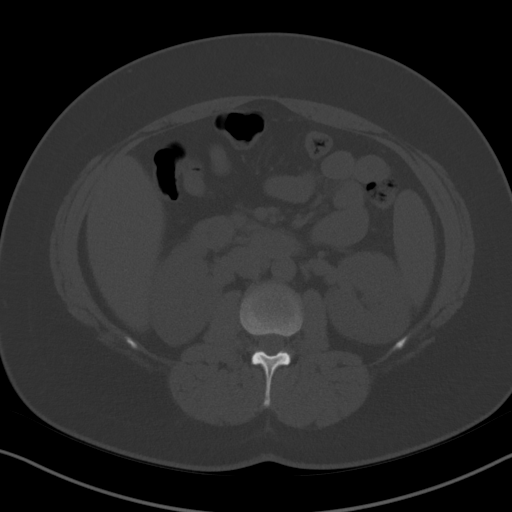
[im 67/91  soft-tissue]
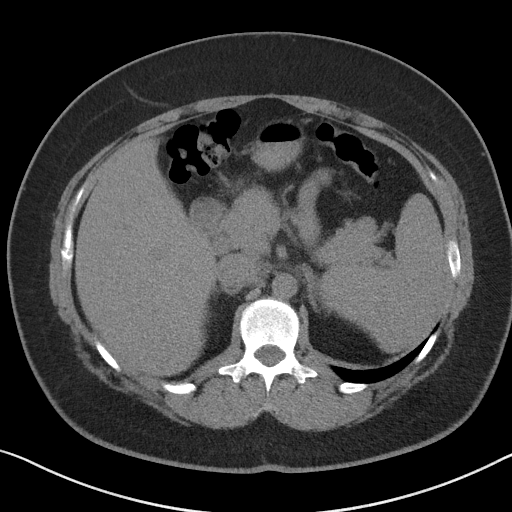
[im 71/91  soft-tissue]
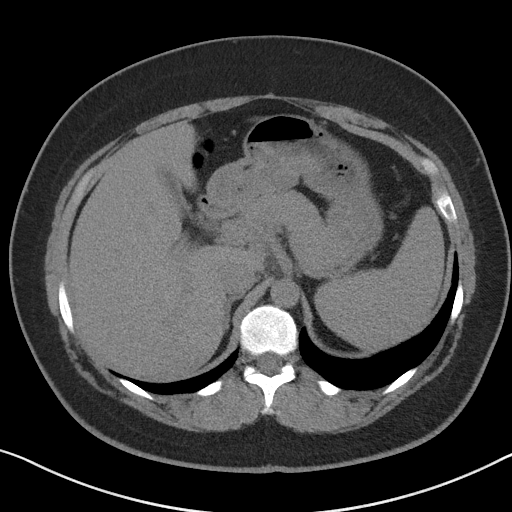
[im 79/91  soft-tissue]
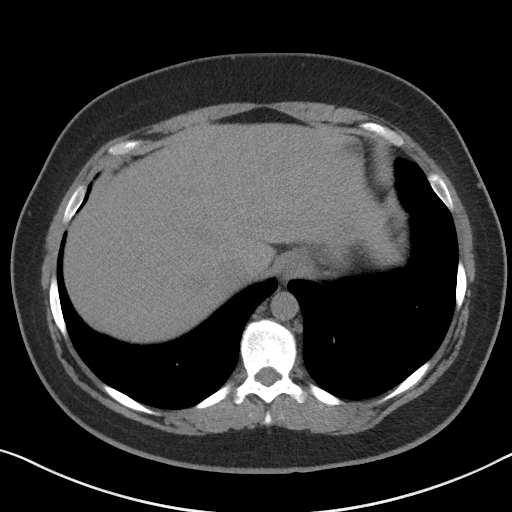
[im 87/91  soft-tissue]
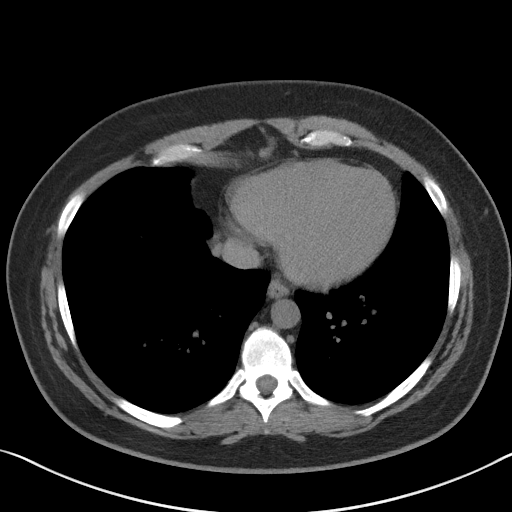

[Series 5: coronal · coronal · 0.73mm/px · 3 of 148 slices shown]
[im 50/148  soft-tissue]
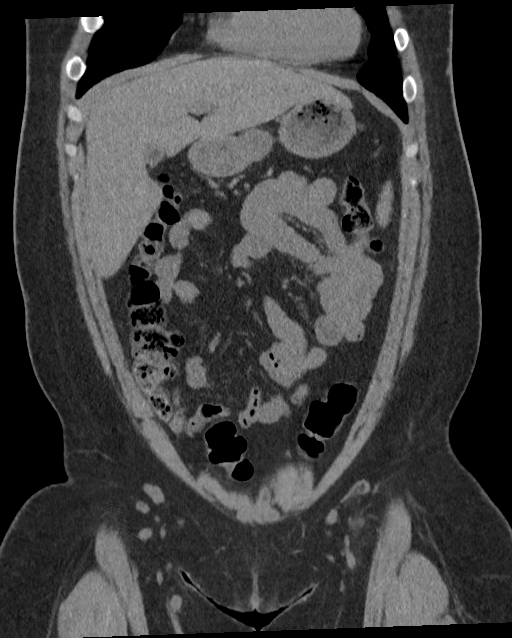
[im 66/148  soft-tissue]
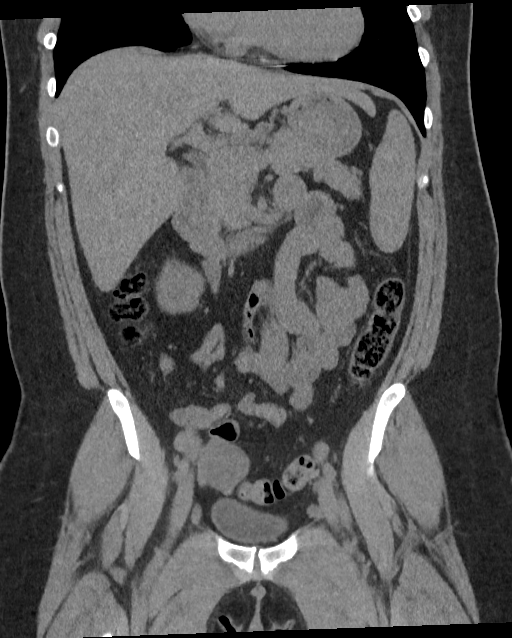
[im 82/148  soft-tissue]
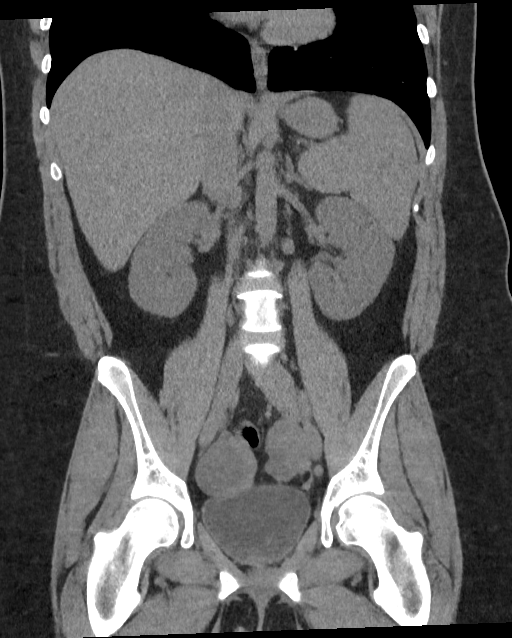

[16 of 46 positions shown; findings below may reference images not displayed]

FINDINGS: Lower chest: Normal

Hepatobiliary: Normal

Pancreas: Normal

Spleen: Normal

Adrenals/Urinary Tract: Adrenal glands are normal. Kidneys are
normal. No cyst, mass, stone or hydronephrosis. No passing stone. No
stone in the bladder.

Stomach/Bowel: Stomach and small intestine are normal. Previous
appendectomy. No acute colon pathology.

Vascular/Lymphatic: Normal

Reproductive: Abnormal appearance of the adnexal regions.
Previously, ultrasound suggested bilateral hydrosalpinx. This is not
well characterized by CT. Consider repeat pelvic ultrasound for
correlation.

Other: No free fluid or air.

Musculoskeletal: Normal
IMPRESSION: 1. No evidence of urinary tract stone disease or other urinary tract
pathology.
2. Previous appendectomy.
3. Abnormal appearance of the adnexal regions. Enlargement of the
regions of the ovaries and fallopian tubes. Previously, ultrasound
suggested bilateral hydrosalpinx. This is not well characterized by
CT. Consider pelvic ultrasound for further characterization
comparison with the prior exam .
